# Patient Record
Sex: Female | Born: 1935 | Race: White | Hispanic: No | Marital: Married | Smoking: Never smoker
Health system: Southern US, Community
[De-identification: ages and names within clinical notes are randomized; demographics above are authoritative.]

## PROBLEM LIST (undated history)

## (undated) DIAGNOSIS — F419 Anxiety disorder, unspecified: Secondary | ICD-10-CM

## (undated) DIAGNOSIS — I509 Heart failure, unspecified: Secondary | ICD-10-CM

## (undated) DIAGNOSIS — J189 Pneumonia, unspecified organism: Secondary | ICD-10-CM

## (undated) DIAGNOSIS — I2510 Atherosclerotic heart disease of native coronary artery without angina pectoris: Secondary | ICD-10-CM

## (undated) DIAGNOSIS — C20 Malignant neoplasm of rectum: Secondary | ICD-10-CM

## (undated) DIAGNOSIS — M1990 Unspecified osteoarthritis, unspecified site: Secondary | ICD-10-CM

## (undated) DIAGNOSIS — I10 Essential (primary) hypertension: Secondary | ICD-10-CM

## (undated) HISTORY — DX: Anxiety disorder, unspecified: F41.9

## (undated) HISTORY — DX: Pneumonia, unspecified organism: J18.9

## (undated) HISTORY — PX: CHOLECYSTECTOMY: SHX55

## (undated) HISTORY — DX: Malignant neoplasm of rectum: C20

## (undated) HISTORY — DX: Atherosclerotic heart disease of native coronary artery without angina pectoris: I25.10

## (undated) HISTORY — DX: Heart failure, unspecified: I50.9

## (undated) HISTORY — DX: Essential (primary) hypertension: I10

## (undated) HISTORY — PX: OTHER SURGICAL HISTORY: SHX169

## (undated) HISTORY — DX: Unspecified osteoarthritis, unspecified site: M19.90

---

## 1971-04-01 HISTORY — PX: TOTAL ABDOMINAL HYSTERECTOMY: SHX209

## 2001-07-02 ENCOUNTER — Other Ambulatory Visit: Admission: RE | Admit: 2001-07-02 | Discharge: 2001-07-02 | Payer: Self-pay | Admitting: Family Medicine

## 2001-07-02 ENCOUNTER — Encounter: Payer: Self-pay | Admitting: Family Medicine

## 2004-05-15 ENCOUNTER — Ambulatory Visit: Payer: Self-pay | Admitting: Family Medicine

## 2004-07-18 ENCOUNTER — Ambulatory Visit: Payer: Self-pay | Admitting: Family Medicine

## 2004-07-22 ENCOUNTER — Ambulatory Visit: Payer: Self-pay | Admitting: Family Medicine

## 2004-08-07 ENCOUNTER — Ambulatory Visit: Payer: Self-pay | Admitting: Family Medicine

## 2004-08-16 ENCOUNTER — Ambulatory Visit: Payer: Self-pay | Admitting: Family Medicine

## 2004-11-14 ENCOUNTER — Ambulatory Visit: Payer: Self-pay | Admitting: Family Medicine

## 2005-02-11 ENCOUNTER — Ambulatory Visit: Payer: Self-pay | Admitting: Family Medicine

## 2005-02-14 ENCOUNTER — Ambulatory Visit: Payer: Self-pay | Admitting: Family Medicine

## 2005-08-14 ENCOUNTER — Ambulatory Visit: Payer: Self-pay | Admitting: Family Medicine

## 2005-08-18 ENCOUNTER — Ambulatory Visit: Payer: Self-pay | Admitting: Family Medicine

## 2005-08-19 ENCOUNTER — Ambulatory Visit: Payer: Self-pay | Admitting: Family Medicine

## 2005-09-05 ENCOUNTER — Ambulatory Visit: Payer: Self-pay | Admitting: Family Medicine

## 2005-11-14 ENCOUNTER — Ambulatory Visit: Payer: Self-pay | Admitting: Family Medicine

## 2005-11-18 ENCOUNTER — Ambulatory Visit: Payer: Self-pay | Admitting: Family Medicine

## 2006-03-17 ENCOUNTER — Ambulatory Visit: Payer: Self-pay | Admitting: Family Medicine

## 2006-08-19 ENCOUNTER — Encounter: Payer: Self-pay | Admitting: Family Medicine

## 2006-08-21 ENCOUNTER — Ambulatory Visit: Payer: Self-pay | Admitting: Family Medicine

## 2006-08-21 LAB — CONVERTED CEMR LAB
ALT: 18 units/L (ref 0–40)
AST: 29 units/L (ref 0–37)
Basophils Relative: 0.5 % (ref 0.0–1.0)
Bilirubin, Direct: 0.1 mg/dL (ref 0.0–0.3)
CO2: 34 meq/L — ABNORMAL HIGH (ref 19–32)
Calcium: 9.6 mg/dL (ref 8.4–10.5)
Chloride: 103 meq/L (ref 96–112)
Eosinophils Relative: 5.2 % — ABNORMAL HIGH (ref 0.0–5.0)
Glucose, Bld: 97 mg/dL (ref 70–99)
HCT: 41.4 % (ref 36.0–46.0)
HDL: 45.2 mg/dL (ref >39.0)
Lymphocytes Relative: 34.3 % (ref 12.0–46.0)
Platelets: 226 10*3/uL (ref 150–400)
RBC: 4.51 M/uL (ref 3.87–5.11)
Total Bilirubin: 1.2 mg/dL (ref 0.3–1.2)
Total Protein: 6.6 g/dL (ref 6.0–8.3)
Triglycerides: 165 mg/dL — ABNORMAL HIGH (ref 0–149)
VLDL: 33 mg/dL (ref 0–40)
WBC: 5.7 10*3/uL (ref 4.5–10.5)

## 2006-08-24 DIAGNOSIS — E039 Hypothyroidism, unspecified: Secondary | ICD-10-CM | POA: Insufficient documentation

## 2006-08-24 DIAGNOSIS — I10 Essential (primary) hypertension: Secondary | ICD-10-CM | POA: Insufficient documentation

## 2006-08-24 DIAGNOSIS — E7800 Pure hypercholesterolemia, unspecified: Secondary | ICD-10-CM

## 2006-08-24 DIAGNOSIS — F411 Generalized anxiety disorder: Secondary | ICD-10-CM | POA: Insufficient documentation

## 2006-08-24 DIAGNOSIS — R32 Unspecified urinary incontinence: Secondary | ICD-10-CM | POA: Insufficient documentation

## 2006-08-25 ENCOUNTER — Ambulatory Visit: Payer: Self-pay | Admitting: Family Medicine

## 2006-08-27 LAB — CONVERTED CEMR LAB: OCCULT 1: NEGATIVE

## 2006-09-08 ENCOUNTER — Ambulatory Visit: Payer: Self-pay | Admitting: Family Medicine

## 2006-09-08 LAB — HM MAMMOGRAPHY: HM Mammogram: NORMAL

## 2006-09-09 ENCOUNTER — Encounter: Payer: Self-pay | Admitting: Family Medicine

## 2006-09-10 ENCOUNTER — Ambulatory Visit: Payer: Self-pay | Admitting: Family Medicine

## 2006-09-11 ENCOUNTER — Encounter (INDEPENDENT_AMBULATORY_CARE_PROVIDER_SITE_OTHER): Payer: Self-pay | Admitting: *Deleted

## 2006-10-25 ENCOUNTER — Emergency Department: Payer: Self-pay | Admitting: Emergency Medicine

## 2006-10-27 ENCOUNTER — Inpatient Hospital Stay: Payer: Self-pay | Admitting: Internal Medicine

## 2006-11-04 ENCOUNTER — Ambulatory Visit: Payer: Self-pay | Admitting: Family Medicine

## 2006-11-04 LAB — CONVERTED CEMR LAB
BUN: 13 mg/dL (ref 6–23)
CO2: 30 meq/L (ref 19–32)
Calcium: 9.1 mg/dL (ref 8.4–10.5)
Creatinine, Ser: 1 mg/dL (ref 0.4–1.2)
GFR calc Af Amer: 68 mL/min

## 2006-11-16 ENCOUNTER — Ambulatory Visit: Payer: Self-pay | Admitting: Family Medicine

## 2006-11-16 LAB — CONVERTED CEMR LAB
Glucose, Urine, Semiquant: NEGATIVE
Protein, U semiquant: 30
pH: 7.5

## 2006-11-17 ENCOUNTER — Encounter: Payer: Self-pay | Admitting: Family Medicine

## 2006-11-18 ENCOUNTER — Telehealth: Payer: Self-pay | Admitting: Family Medicine

## 2006-11-29 ENCOUNTER — Encounter: Payer: Self-pay | Admitting: Family Medicine

## 2006-12-01 ENCOUNTER — Ambulatory Visit: Payer: Self-pay | Admitting: Family Medicine

## 2006-12-01 LAB — CONVERTED CEMR LAB
Bilirubin Urine: NEGATIVE
Nitrite: NEGATIVE
Protein, U semiquant: NEGATIVE
Specific Gravity, Urine: <1.005
WBC Urine, dipstick: NEGATIVE

## 2007-02-12 ENCOUNTER — Ambulatory Visit: Payer: Self-pay | Admitting: Family Medicine

## 2007-02-14 LAB — CONVERTED CEMR LAB
CO2: 33 meq/L — ABNORMAL HIGH (ref 19–32)
Creatinine, Ser: 0.9 mg/dL (ref 0.4–1.2)
Glucose, Bld: 100 mg/dL — ABNORMAL HIGH (ref 70–99)
Potassium: 3.2 meq/L — ABNORMAL LOW (ref 3.5–5.1)
Sodium: 139 meq/L (ref 135–145)

## 2007-02-15 ENCOUNTER — Ambulatory Visit: Payer: Self-pay | Admitting: Family Medicine

## 2007-03-12 ENCOUNTER — Ambulatory Visit: Payer: Self-pay | Admitting: Family Medicine

## 2007-03-17 ENCOUNTER — Ambulatory Visit: Payer: Self-pay | Admitting: Family Medicine

## 2007-03-17 DIAGNOSIS — E876 Hypokalemia: Secondary | ICD-10-CM

## 2007-03-19 ENCOUNTER — Telehealth: Payer: Self-pay | Admitting: Family Medicine

## 2007-04-29 ENCOUNTER — Ambulatory Visit: Payer: Self-pay | Admitting: Family Medicine

## 2007-04-29 LAB — CONVERTED CEMR LAB
CO2: 31 meq/L (ref 19–32)
Chloride: 101 meq/L (ref 96–112)
Creatinine, Ser: 1.1 mg/dL (ref 0.4–1.2)
Glucose, Bld: 95 mg/dL (ref 70–99)
Sodium: 141 meq/L (ref 135–145)

## 2007-05-06 ENCOUNTER — Ambulatory Visit: Payer: Self-pay | Admitting: Family Medicine

## 2007-09-14 ENCOUNTER — Ambulatory Visit: Payer: Self-pay | Admitting: Family Medicine

## 2007-09-14 LAB — CONVERTED CEMR LAB
Alkaline Phosphatase: 68 units/L (ref 39–117)
Basophils Absolute: 0 10*3/uL (ref 0.0–0.1)
Bilirubin, Direct: 0.1 mg/dL (ref 0.0–0.3)
Calcium: 9.3 mg/dL (ref 8.4–10.5)
Free T4: 1.1 ng/dL (ref 0.6–1.6)
GFR calc Af Amer: 61 mL/min
Glucose, Bld: 105 mg/dL — ABNORMAL HIGH (ref 70–99)
HCT: 42.9 % (ref 36.0–46.0)
HDL: 44.5 mg/dL (ref >39.0)
INR: 1 (ref 0.8–1.0)
Lymphocytes Relative: 27.2 % (ref 12.0–46.0)
MCHC: 33.5 g/dL (ref 30.0–36.0)
Monocytes Absolute: 0.4 10*3/uL (ref 0.1–1.0)
Monocytes Relative: 7.7 % (ref 3.0–12.0)
Platelets: 199 10*3/uL (ref 150–400)
Potassium: 3.3 meq/L — ABNORMAL LOW (ref 3.5–5.1)
Prothrombin Time: 11.8 s (ref 10.9–13.3)
RDW: 13.5 % (ref 11.5–14.6)
Sodium: 141 meq/L (ref 135–145)
TSH: 4.33 microintl units/mL (ref 0.35–5.50)
Total Bilirubin: 1.3 mg/dL — ABNORMAL HIGH (ref 0.3–1.2)
Total Protein: 6.7 g/dL (ref 6.0–8.3)
Triglycerides: 140 mg/dL (ref 0–149)
VLDL: 28 mg/dL (ref 0–40)
aPTT: 28 s (ref 21.7–29.8)

## 2007-09-16 ENCOUNTER — Ambulatory Visit: Payer: Self-pay | Admitting: Family Medicine

## 2007-10-19 ENCOUNTER — Ambulatory Visit: Payer: Self-pay | Admitting: Family Medicine

## 2007-10-19 LAB — CONVERTED CEMR LAB: Potassium: 3.6 meq/L (ref 3.5–5.1)

## 2007-10-28 ENCOUNTER — Ambulatory Visit: Payer: Self-pay | Admitting: Family Medicine

## 2007-10-28 ENCOUNTER — Encounter (INDEPENDENT_AMBULATORY_CARE_PROVIDER_SITE_OTHER): Payer: Self-pay | Admitting: *Deleted

## 2007-10-28 LAB — CONVERTED CEMR LAB
OCCULT 1: NEGATIVE
OCCULT 3: NEGATIVE

## 2007-10-28 LAB — FECAL OCCULT BLOOD, GUAIAC: Fecal Occult Blood: NEGATIVE

## 2008-01-10 ENCOUNTER — Ambulatory Visit: Payer: Self-pay | Admitting: Family Medicine

## 2008-01-10 LAB — CONVERTED CEMR LAB
BUN: 18 mg/dL (ref 6–23)
Calcium: 9 mg/dL (ref 8.4–10.5)
Creatinine, Ser: 1 mg/dL (ref 0.4–1.2)
GFR calc Af Amer: 68 mL/min
GFR calc non Af Amer: 56 mL/min
Glucose, Bld: 112 mg/dL — ABNORMAL HIGH (ref 70–99)
Potassium: 3.7 meq/L (ref 3.5–5.1)

## 2008-02-15 ENCOUNTER — Ambulatory Visit: Payer: Self-pay | Admitting: Family Medicine

## 2008-02-15 LAB — CONVERTED CEMR LAB
Blood in Urine, dipstick: NEGATIVE
Glucose, Urine, Semiquant: NEGATIVE
Ketones, urine, test strip: NEGATIVE
Protein, U semiquant: NEGATIVE
RBC / HPF: 0

## 2008-03-15 ENCOUNTER — Ambulatory Visit: Payer: Self-pay | Admitting: Family Medicine

## 2008-04-19 ENCOUNTER — Ambulatory Visit: Payer: Self-pay | Admitting: Family Medicine

## 2008-04-19 LAB — CONVERTED CEMR LAB: TSH: 2.5 microintl units/mL (ref 0.35–5.50)

## 2008-04-26 ENCOUNTER — Ambulatory Visit: Payer: Self-pay | Admitting: Family Medicine

## 2008-06-29 ENCOUNTER — Ambulatory Visit: Payer: Self-pay | Admitting: Family Medicine

## 2008-06-29 LAB — CONVERTED CEMR LAB
Calcium: 9.3 mg/dL (ref 8.4–10.5)
GFR calc non Af Amer: 56.24 mL/min (ref >60)
Glucose, Bld: 89 mg/dL (ref 70–99)
Sodium: 137 meq/L (ref 135–145)

## 2008-07-04 ENCOUNTER — Ambulatory Visit: Payer: Self-pay | Admitting: Family Medicine

## 2008-07-04 LAB — CONVERTED CEMR LAB
Albumin: 3.3 g/dL — ABNORMAL LOW (ref 3.5–5.2)
Alkaline Phosphatase: 62 units/L (ref 39–117)
Bilirubin, Direct: 0.2 mg/dL (ref 0.0–0.3)

## 2008-07-17 ENCOUNTER — Ambulatory Visit: Payer: Self-pay | Admitting: Family Medicine

## 2008-07-17 ENCOUNTER — Telehealth: Payer: Self-pay | Admitting: Family Medicine

## 2008-07-24 ENCOUNTER — Ambulatory Visit: Payer: Self-pay | Admitting: Family Medicine

## 2008-12-27 ENCOUNTER — Ambulatory Visit: Payer: Self-pay | Admitting: Family Medicine

## 2008-12-27 LAB — CONVERTED CEMR LAB
AST: 23 units/L (ref 0–37)
Alkaline Phosphatase: 68 units/L (ref 39–117)
BUN: 22 mg/dL (ref 6–23)
Basophils Absolute: 0 10*3/uL (ref 0.0–0.1)
Bilirubin, Direct: 0.1 mg/dL (ref 0.0–0.3)
Calcium: 9.4 mg/dL (ref 8.4–10.5)
Creatinine, Ser: 1.1 mg/dL (ref 0.4–1.2)
Direct LDL: 174.8 mg/dL
GFR calc non Af Amer: 50.32 mL/min (ref >60)
Glucose, Bld: 99 mg/dL (ref 70–99)
HDL: 44 mg/dL (ref >39.00)
Hemoglobin: 14.4 g/dL (ref 12.0–15.0)
Lymphocytes Relative: 30.2 % (ref 12.0–46.0)
Monocytes Relative: 9.4 % (ref 3.0–12.0)
Neutrophils Relative %: 55 % (ref 43.0–77.0)
Platelets: 211 10*3/uL (ref 150.0–400.0)
RDW: 13 % (ref 11.5–14.6)
Sodium: 140 meq/L (ref 135–145)
Total Bilirubin: 1 mg/dL (ref 0.3–1.2)
Triglycerides: 153 mg/dL — ABNORMAL HIGH (ref 0.0–149.0)

## 2008-12-28 LAB — CONVERTED CEMR LAB: Vit D, 25-Hydroxy: 33 ng/mL (ref 30–89)

## 2009-01-03 ENCOUNTER — Ambulatory Visit: Payer: Self-pay | Admitting: Family Medicine

## 2009-01-10 ENCOUNTER — Ambulatory Visit: Payer: Self-pay | Admitting: Family Medicine

## 2009-01-10 DIAGNOSIS — K648 Other hemorrhoids: Secondary | ICD-10-CM

## 2009-01-16 ENCOUNTER — Ambulatory Visit: Payer: Self-pay | Admitting: Family Medicine

## 2009-01-18 ENCOUNTER — Telehealth: Payer: Self-pay | Admitting: Family Medicine

## 2009-01-22 ENCOUNTER — Ambulatory Visit: Payer: Self-pay | Admitting: Family Medicine

## 2009-01-23 ENCOUNTER — Encounter: Payer: Self-pay | Admitting: Family Medicine

## 2009-01-23 LAB — CONVERTED CEMR LAB
ALT: 14 units/L (ref 0–35)
Albumin: 3.7 g/dL (ref 3.5–5.2)
Alkaline Phosphatase: 75 units/L (ref 39–117)
Basophils Relative: 1 % (ref 0.0–3.0)
Bilirubin, Direct: 0.1 mg/dL (ref 0.0–0.3)
CO2: 31 meq/L (ref 19–32)
Chloride: 96 meq/L (ref 96–112)
Eosinophils Absolute: 0.1 10*3/uL (ref 0.0–0.7)
Eosinophils Relative: 2 % (ref 0.0–5.0)
Hemoglobin: 14.1 g/dL (ref 12.0–15.0)
Lymphocytes Relative: 22.9 % (ref 12.0–46.0)
MCHC: 33.5 g/dL (ref 30.0–36.0)
MCV: 95 fL (ref 78.0–100.0)
Monocytes Absolute: 0.6 10*3/uL (ref 0.1–1.0)
Neutro Abs: 4.5 10*3/uL (ref 1.4–7.7)
RBC: 4.42 M/uL (ref 3.87–5.11)
Sodium: 137 meq/L (ref 135–145)
Total Protein: 6.6 g/dL (ref 6.0–8.3)
WBC: 6.9 10*3/uL (ref 4.5–10.5)

## 2009-03-22 ENCOUNTER — Telehealth: Payer: Self-pay | Admitting: Family Medicine

## 2009-06-15 ENCOUNTER — Telehealth: Payer: Self-pay | Admitting: Family Medicine

## 2009-07-09 ENCOUNTER — Ambulatory Visit: Payer: Self-pay | Admitting: Family Medicine

## 2009-07-30 ENCOUNTER — Ambulatory Visit: Payer: Self-pay | Admitting: Family Medicine

## 2009-08-13 ENCOUNTER — Ambulatory Visit: Payer: Self-pay | Admitting: Family Medicine

## 2009-08-16 ENCOUNTER — Telehealth: Payer: Self-pay | Admitting: Family Medicine

## 2009-08-23 ENCOUNTER — Telehealth: Payer: Self-pay | Admitting: Family Medicine

## 2009-09-12 ENCOUNTER — Telehealth: Payer: Self-pay | Admitting: Family Medicine

## 2009-09-13 ENCOUNTER — Ambulatory Visit: Payer: Self-pay | Admitting: Family Medicine

## 2009-11-05 ENCOUNTER — Encounter (INDEPENDENT_AMBULATORY_CARE_PROVIDER_SITE_OTHER): Payer: Self-pay | Admitting: *Deleted

## 2009-11-15 ENCOUNTER — Ambulatory Visit: Payer: Self-pay | Admitting: Family Medicine

## 2009-11-15 DIAGNOSIS — R21 Rash and other nonspecific skin eruption: Secondary | ICD-10-CM | POA: Insufficient documentation

## 2009-11-22 ENCOUNTER — Telehealth: Payer: Self-pay | Admitting: Family Medicine

## 2009-12-10 ENCOUNTER — Ambulatory Visit: Payer: Self-pay | Admitting: Family Medicine

## 2009-12-11 LAB — CONVERTED CEMR LAB
BUN: 16 mg/dL (ref 6–23)
Calcium: 8.8 mg/dL (ref 8.4–10.5)
GFR calc non Af Amer: 67.6 mL/min (ref >60)
Glucose, Bld: 93 mg/dL (ref 70–99)
Potassium: 3.9 meq/L (ref 3.5–5.1)
TSH: 1.88 microintl units/mL (ref 0.35–5.50)

## 2010-03-31 HISTORY — PX: CORONARY ARTERY BYPASS GRAFT: SHX141

## 2010-04-16 ENCOUNTER — Encounter: Payer: Self-pay | Admitting: Family Medicine

## 2010-04-22 ENCOUNTER — Encounter: Payer: Self-pay | Admitting: Family Medicine

## 2010-04-22 ENCOUNTER — Inpatient Hospital Stay: Payer: Self-pay | Admitting: Specialist

## 2010-04-23 ENCOUNTER — Inpatient Hospital Stay (HOSPITAL_COMMUNITY)
Admission: EM | Admit: 2010-04-23 | Discharge: 2010-05-02 | DRG: 236 | Disposition: A | Discharge status: Skilled Nursing Facility | Payer: Medicare HMO | Source: Other Acute Inpatient Hospital | Attending: Thoracic Surgery (Cardiothoracic Vascular Surgery) | Admitting: Thoracic Surgery (Cardiothoracic Vascular Surgery)

## 2010-04-23 ENCOUNTER — Encounter: Payer: Self-pay | Admitting: Family Medicine

## 2010-04-23 DIAGNOSIS — I214 Non-ST elevation (NSTEMI) myocardial infarction: Principal | ICD-10-CM | POA: Diagnosis present

## 2010-04-23 DIAGNOSIS — E039 Hypothyroidism, unspecified: Secondary | ICD-10-CM | POA: Diagnosis present

## 2010-04-23 DIAGNOSIS — I9589 Other hypotension: Secondary | ICD-10-CM | POA: Diagnosis not present

## 2010-04-23 DIAGNOSIS — M810 Age-related osteoporosis without current pathological fracture: Secondary | ICD-10-CM | POA: Diagnosis present

## 2010-04-23 DIAGNOSIS — Z79899 Other long term (current) drug therapy: Secondary | ICD-10-CM

## 2010-04-23 DIAGNOSIS — I2510 Atherosclerotic heart disease of native coronary artery without angina pectoris: Secondary | ICD-10-CM | POA: Diagnosis present

## 2010-04-23 DIAGNOSIS — D72829 Elevated white blood cell count, unspecified: Secondary | ICD-10-CM | POA: Diagnosis not present

## 2010-04-23 DIAGNOSIS — Z7982 Long term (current) use of aspirin: Secondary | ICD-10-CM

## 2010-04-23 DIAGNOSIS — I10 Essential (primary) hypertension: Secondary | ICD-10-CM | POA: Diagnosis present

## 2010-04-23 DIAGNOSIS — E8779 Other fluid overload: Secondary | ICD-10-CM | POA: Diagnosis not present

## 2010-04-23 DIAGNOSIS — E876 Hypokalemia: Secondary | ICD-10-CM | POA: Diagnosis not present

## 2010-04-23 DIAGNOSIS — D696 Thrombocytopenia, unspecified: Secondary | ICD-10-CM | POA: Diagnosis not present

## 2010-04-23 DIAGNOSIS — D62 Acute posthemorrhagic anemia: Secondary | ICD-10-CM | POA: Diagnosis not present

## 2010-04-23 DIAGNOSIS — I498 Other specified cardiac arrhythmias: Secondary | ICD-10-CM | POA: Diagnosis not present

## 2010-04-24 LAB — COMPREHENSIVE METABOLIC PANEL
Alkaline Phosphatase: 52 U/L (ref 39–117)
BUN: 16 mg/dL (ref 6–23)
Chloride: 103 mEq/L (ref 96–112)
Glucose, Bld: 116 mg/dL — ABNORMAL HIGH (ref 70–99)
Potassium: 3.8 mEq/L (ref 3.5–5.1)
Total Bilirubin: 1.1 mg/dL (ref 0.3–1.2)

## 2010-04-24 LAB — HEMOGLOBIN AND HEMATOCRIT, BLOOD: HCT: 21.2 % — ABNORMAL LOW (ref 36.0–46.0)

## 2010-04-24 LAB — CBC
HCT: 34.3 % — ABNORMAL LOW (ref 36.0–46.0)
HCT: 35.3 % — ABNORMAL LOW (ref 36.0–46.0)
Hemoglobin: 11.3 g/dL — ABNORMAL LOW (ref 12.0–15.0)
Hemoglobin: 11.8 g/dL — ABNORMAL LOW (ref 12.0–15.0)
MCH: 29.9 pg (ref 26.0–34.0)
MCHC: 33.4 g/dL (ref 30.0–36.0)
MCHC: 33.6 g/dL (ref 30.0–36.0)
MCV: 90.7 fL (ref 78.0–100.0)
MCV: 89 fL (ref 78.0–100.0)
Platelets: 112 10*3/uL — ABNORMAL LOW (ref 150–400)
RBC: 3.78 MIL/uL — ABNORMAL LOW (ref 3.87–5.11)
RDW: 14.9 % (ref 11.5–15.5)
RDW: 15.1 % (ref 11.5–15.5)
WBC: 8.3 10*3/uL (ref 4.0–10.5)
WBC: 21.1 10*3/uL — ABNORMAL HIGH (ref 4.0–10.5)

## 2010-04-24 LAB — GLUCOSE, CAPILLARY

## 2010-04-24 LAB — POCT I-STAT, CHEM 8
Chloride: 106 mEq/L (ref 96–112)
HCT: 24 % — ABNORMAL LOW (ref 36.0–46.0)
Hemoglobin: 8.2 g/dL — ABNORMAL LOW (ref 12.0–15.0)
Potassium: 4.2 mEq/L (ref 3.5–5.1)
Sodium: 138 mEq/L (ref 135–145)

## 2010-04-24 LAB — POCT I-STAT 3, ART BLOOD GAS (G3+)
Acid-base deficit: 2 mmol/L (ref 0.0–2.0)
Acid-base deficit: 6 mmol/L — ABNORMAL HIGH (ref 0.0–2.0)
Bicarbonate: 22.7 mEq/L (ref 20.0–24.0)
O2 Saturation: 100 %
O2 Saturation: 97 %
Patient temperature: 35.6
TCO2: 24 mmol/L (ref 0–100)
TCO2: 25 mmol/L (ref 0–100)
pCO2 arterial: 41.8 mmHg (ref 35.0–45.0)
pH, Arterial: 7.391 (ref 7.350–7.400)
pH, Arterial: 7.358 (ref 7.350–7.400)
pO2, Arterial: 275 mmHg — ABNORMAL HIGH (ref 80.0–100.0)
pO2, Arterial: 97 mmHg (ref 80.0–100.0)

## 2010-04-24 LAB — BLOOD GAS, ARTERIAL
Bicarbonate: 24.3 mEq/L — ABNORMAL HIGH (ref 20.0–24.0)
O2 Saturation: 97.5 %
Patient temperature: 98.6
TCO2: 25.4 mmol/L (ref 0–100)
pH, Arterial: 7.444 — ABNORMAL HIGH (ref 7.350–7.400)
pO2, Arterial: 90.4 mmHg (ref 80.0–100.0)

## 2010-04-24 LAB — HEPARIN LEVEL (UNFRACTIONATED): Heparin Unfractionated: 0.26 IU/mL — ABNORMAL LOW (ref 0.30–0.70)

## 2010-04-24 LAB — POCT I-STAT 4, (NA,K, GLUC, HGB,HCT)
Glucose, Bld: 120 mg/dL — ABNORMAL HIGH (ref 70–99)
Glucose, Bld: 121 mg/dL — ABNORMAL HIGH (ref 70–99)
HCT: 33 % — ABNORMAL LOW (ref 36.0–46.0)
HCT: 20 % — ABNORMAL LOW (ref 36.0–46.0)
HCT: 35 % — ABNORMAL LOW (ref 36.0–46.0)
Hemoglobin: 11.2 g/dL — ABNORMAL LOW (ref 12.0–15.0)
Hemoglobin: 7.1 g/dL — ABNORMAL LOW (ref 12.0–15.0)
Hemoglobin: 6.8 g/dL — CL (ref 12.0–15.0)
Potassium: 3.2 mEq/L — ABNORMAL LOW (ref 3.5–5.1)
Potassium: 4 mEq/L (ref 3.5–5.1)
Sodium: 138 mEq/L (ref 135–145)
Sodium: 134 mEq/L — ABNORMAL LOW (ref 135–145)
Sodium: 136 mEq/L (ref 135–145)
Sodium: 137 mEq/L (ref 135–145)

## 2010-04-24 LAB — PROTIME-INR
INR: 1.39 (ref 0.00–1.49)
Prothrombin Time: 13.4 seconds (ref 11.6–15.2)

## 2010-04-24 LAB — URINALYSIS, ROUTINE W REFLEX MICROSCOPIC
Bilirubin Urine: NEGATIVE
Hgb urine dipstick: NEGATIVE
Specific Gravity, Urine: 1.013 (ref 1.005–1.030)
pH: 6 (ref 5.0–8.0)

## 2010-04-24 LAB — PLATELET COUNT: Platelets: 124 10*3/uL — ABNORMAL LOW (ref 150–400)

## 2010-04-24 LAB — MRSA PCR SCREENING: MRSA by PCR: NEGATIVE

## 2010-04-24 LAB — MAGNESIUM: Magnesium: 2.9 mg/dL — ABNORMAL HIGH (ref 1.5–2.5)

## 2010-04-24 LAB — APTT: aPTT: 34 seconds (ref 24–37)

## 2010-04-25 DIAGNOSIS — I2510 Atherosclerotic heart disease of native coronary artery without angina pectoris: Secondary | ICD-10-CM | POA: Insufficient documentation

## 2010-04-25 LAB — POCT I-STAT, CHEM 8
BUN: 13 mg/dL (ref 6–23)
Calcium, Ion: 1.23 mmol/L (ref 1.12–1.32)
Creatinine, Ser: 1 mg/dL (ref 0.4–1.2)
TCO2: 23 mmol/L (ref 0–100)

## 2010-04-25 LAB — GLUCOSE, CAPILLARY
Glucose-Capillary: 104 mg/dL — ABNORMAL HIGH (ref 70–99)
Glucose-Capillary: 96 mg/dL (ref 70–99)
Glucose-Capillary: 98 mg/dL (ref 70–99)
Glucose-Capillary: 139 mg/dL — ABNORMAL HIGH (ref 70–99)
Glucose-Capillary: 171 mg/dL — ABNORMAL HIGH (ref 70–99)

## 2010-04-25 LAB — CBC
Hemoglobin: 8.2 g/dL — ABNORMAL LOW (ref 12.0–15.0)
MCH: 29.9 pg (ref 26.0–34.0)
MCH: 30.8 pg (ref 26.0–34.0)
MCHC: 33.7 g/dL (ref 30.0–36.0)
MCHC: 34.8 g/dL (ref 30.0–36.0)
MCV: 88.5 fL (ref 78.0–100.0)
Platelets: 96 10*3/uL — ABNORMAL LOW (ref 150–400)
RDW: 15.3 % (ref 11.5–15.5)
WBC: 11.8 10*3/uL — ABNORMAL HIGH (ref 4.0–10.5)

## 2010-04-25 LAB — BASIC METABOLIC PANEL
CO2: 21 mEq/L (ref 19–32)
Calcium: 8 mg/dL — ABNORMAL LOW (ref 8.4–10.5)
Creatinine, Ser: 0.92 mg/dL (ref 0.4–1.2)
GFR calc Af Amer: >60 mL/min (ref >60)

## 2010-04-25 NOTE — Op Note (Addendum)
[name], BIGNELL             ACCOUNT NO.:  [id number]  MEDICAL RECORD NO.:  [id number]          PATIENT TYPE:  INP  LOCATION:  2313                         FACILITY:  [facility]  PHYSICIAN:  Salvatore Decent. Hendrickson, M.D.DATE OF BIRTH:  [birth year]  DATE OF PROCEDURE:  04/24/2010 DATE OF DISCHARGE:                              OPERATIVE REPORT   PREOPERATIVE DIAGNOSIS:  Critical left main and three-vessel disease, status post non-Q-wave myocardial infarction.  POSTOPERATIVE DIAGNOSIS:  Critical left main and three-vessel disease, status post non-Q-wave myocardial infarction.  PROCEDURES:  Median sternotomy, extracorporeal circulation; coronary artery bypass grafting x3 (left internal mammary artery to left anterior descending, saphenous vein graft to obtuse marginal-1 , saphenous vein graft to distal right coronary); endoscopic vein harvest, right leg.  SURGEON:  Salvatore Decent. Dorris Fetch, MD  ASSISTANT:  Stephanie Acre. Dasovich, PA  ANESTHESIA:  General.  FINDINGS:  PD not graftable.  Good quality targets.  Good quality conduits.  Sternal osteoporosis.  CLINICAL NOTE:  Betty Dunn is an 77 year old woman who presented with unstable coronary syndrome.  She ruled in for non-Q-wave myocardial infarction.  She underwent cardiac catheterization at [facility] which revealed critical 95% left main stenosis as well as a tight ostial stenosis in her right coronary and a 95% stenosis in the midportion of her posterior descending.  She was advised to undergo coronary artery bypass grafting.  Indications, risks, benefits, and alternatives were discussed in detail with the patient.  She understood and accepted the risks and agreed to proceed.  OPERATIVE NOTE:  [name] was brought to the preop holding area on April 24, 2010.  There, the Anesthesia Service under direction of Dr. Arta Bruce placed an arterial blood pressure monitoring line and Swan- Ganz catheter.   Intravenous antibiotics were administered.  She was taken to the operating room, anesthetized, and intubated.  A Foley catheter was placed.  The chest, abdomen, and legs were prepped and draped in usual fashion.  An incision was made on the medial aspect of the right leg at the level of the native greater saphenous vein was harvested endoscopically from groin to upper calf.  It was a large caliber but relatively good quality vein.  Simultaneously, a median sternotomy was performed and the left internal mammary artery was harvested using standard technique.  There was sternal osteoporosis. The mammary artery was expected size and quality.  A 2000 units of heparin was administered during the vessel harvest and remaining of the full heparin dose was given prior to opening the pericardium.  Pericardium was opened.  The ascending aorta was inspected.  There was no palpable atherosclerotic disease.  The aorta was cannulated via concentric 2-0 Ethibond pledgeted pursestring sutures.  A dual-stage venous cannula was placed via pursestring suture in the atrial appendage.  Cardiopulmonary bypass was instituted, and the patient was cooled to 32 degrees Celsius.  The coronary arteries were inspected and anastomotic sites were chosen.  Conduits were inspected and cut to length.  A foam pad was placed in the pericardium to insulate the heart and protect the left phrenic nerve.  A temperature probe was placed in the myocardial septum,  and a cardioplegia cannula was placed in the ascending aorta.  The aorta was cross-clamped.  The left ventricle was emptied via the aortic root vent.  Cardiac arrest was achieved with a combination of cold antegrade blood cardioplegia and topical iced saline.  A 1 liter of cardioplegia was administered.  The myocardial septal temperature cooled to 12 degrees Celsius following distal anastomoses were performed.  First, a reverse saphenous vein graft was placed  end-to-side to the distal right coronary artery.  Posterior descending was too small to graft distally, but the distal right coronary artery was a 1.5-mm good quality target.  The vein graft was relatively large in caliber and was anastomosed end-to-side with a running 7-0 Prolene suture.  All anastomoses were probed proximally and distally.  At their completion, each vein graft was used to administer cardioplegia to assess flow and hemostasis.  Next, a reverse saphenous vein graft was placed end-to-side to the first obtuse marginal.  This was the one dominant lateral branch, it was a 1.5- mm good quality target.  The vein graft was anastomosed end-to-side with a running 7-0 Prolene suture.  Again, there was good flow and good hemostasis.  Next, the left internal mammary artery was brought through a window in the pericardium.  The distal end was beveled and was anastomosed end-to- side to the distal LAD.  The LAD was a 1.5-mm good quality target. Mammary was a 1.5-mm good quality conduit with good flow.  The end-to- side anastomosis was performed with a running 8-0 Prolene suture.  At the completion of the mammary to LAD anastomosis, the bulldog clamp was briefly removed from the mammary artery.  Immediate rapid septal rewarming was noted.  The bulldog clamp was replaced.  The mammary pedicle was tacked to the epicardial surface of the heart with 6-0 Prolene sutures.  Additional cardioplegia was administered.  Rewarming was begun.  The cardioplegia cannula was removed from the ascending aorta.  Proximal vein graft anastomoses were performed with 4.5-mm punch aortotomies and running 6-0 Prolene sutures.  At the completion of final proximal anastomosis, the patient was placed in Trendelenburg position. Lidocaine was administered.  The aortic root was deaired and aortic cross-clamp was removed.  Total cross-clamp time was 51 minutes.  The patient required single defibrillation with 10  joules and resumed sinus rhythm thereafter.  While rewarming was completed, all proximal and distal anastomoses were inspected for hemostasis.  Epicardial pacing wires were placed on the right ventricle and right atrium.  When the patient had first come off bypass, it was noted to have marked ST elevations.  These did improve somewhat over the next several minutes prior to weaning from bypass, although there still was some ST elevation present.  When the patient had rewarmed to a core temperature of 37 degrees Celsius, she was weaned from cardiopulmonary bypass.  She was on a dopamine infusion at 3 mcg per kg per minute and DDD paced at 90 beats per minute time separation from bypass.  Total bypass time was 71 minutes.  A test dose of protamine was administered and was well tolerated.  The atrial and aortic cannulae were removed.  The remainder of the protamine was administered without incident.  Chest was irrigated with 1 liter of saline.  Hemostasis was achieved.  Pericardium was not reapproximated. A left pleural and single mediastinal chest tubes were placed through separate subcostal incisions.  The sternum was closed with heavy gauge interrupted sternal stainless steel wires.  The pectoralis fascia, subcutaneous tissue,  and skin were closed in standard fashion.  All sponge, needle, and instrument counts were correct at the end of the procedure.  The patient was taken from the operating room to the Surgical Intensive Care Unit in good condition.     Salvatore Decent Dorris Fetch, M.D.     SCH/MEDQ  D:  04/24/2010  T:  04/25/2010  Job:  [id number]  cc:   Bevelyn Buckles. Bensimhon, MD Dwana Curd. Para March, M.D.  Electronically Signed by Charlett Lango M.D. on 04/25/2010 11:33:58 AM

## 2010-04-25 NOTE — Consult Note (Addendum)
Dunn, Betty             ACCOUNT NO.:  [id number]  MEDICAL RECORD NO.:  [id number]          PATIENT TYPE:  INP  LOCATION:  2909                         FACILITY:  [facility]  PHYSICIAN:  Salvatore Decent. Dorris Fetch, M.D.DATE OF BIRTH:  1923/01/29  DATE OF CONSULTATION:  04/23/2010 DATE OF DISCHARGE:                                CONSULTATION   REASON FOR CONSULTATION:  Left main and three-vessel disease.  HISTORY OF PRESENT ILLNESS:  Betty Dunn is an 77 year old woman with a history of hypertension and hypothyroidism, but no prior cardiac history.  She was in fairly good health, she felt, until Sunday when she began having diarrhea.  She had it for about 24 hours, and then Monday morning, she developed left arm pain.  This was a constant pain, and she had it for about 12 hours and eventually went to [facility].  In the emergency room, she did not have any acute ST changes, but her troponin was 7.2.  She was started on heparin and nitroglycerin.  She did not have any further pain since admission.  She had cardiac catheterization today where she was found to have left main and three- vessel disease.  Her ejection fraction was approximately 35% with severe dyskinesis of the apex.  The patient is currently pain free, and again, has had no pain since admission to the hospital.  PAST MEDICAL HISTORY:  Significant for hypertension, hypokalemia, hypothyroidism, and osteoarthritis.  She recently had a steroid injection of her left knee.  PAST SURGICAL HISTORY:  Significant for hysterectomy.  MEDICATIONS PRIOR TO ADMISSION: 1. Aspirin 81 mg daily. 2. Levoxyl 25 mcg daily. 3. Hydrochlorothiazide 12.5 mg daily. 4. She also took potassium chloride daily.  ALLERGIES:  She has an allergy to CIPRO which causes a rash and also has an adverse reaction to milk of magnesia which causes gastrointestinal discomfort.  FAMILY HISTORY:  Significant for coronary artery disease.  SOCIAL  HISTORY:  She is widowed.  She lives by herself.  Her sister lives nearby.  She has one son, no grandchildren.  She does not use tobacco.  REVIEW OF SYSTEMS:  There is no arthritis pain.  Denies any prodromal symptoms leading up to her diarrhea.  She does not have any fevers, chills, or sweats.  Denies any previous pain such as this.  No problems with orthopnea, paroxysmal nocturnal dyspnea, or peripheral edema.  PHYSICAL EXAMINATION:  VITAL SIGNS:  Betty Dunn is an 77 year old woman who appears her stated age.  Her blood pressure is 86/55, pulse 77, respirations are 20, oxygen saturation 99% on 2 L nasal cannula. GENERAL APPEARANCE:  Elderly and frail. NEUROLOGIC:  She is alert, oriented x3 with no focal deficits. HEENT:  Unremarkable. NECK:  Supple without thyromegaly, adenopathy, or bruits. CARDIAC:  Regular rate and rhythm.  Normal S1 and S2.  No rubs, murmurs, or gallops. ABDOMEN:  Soft, nontender. LUNGS:  Clear with equal breath sounds. EXTREMITIES:  She has 2+ radial pulses bilaterally.  Faintly palpable posterior tibial and dorsalis pedis pulses bilaterally.  She does have some superficial varicosities.  LABORATORY DATA:  Glucose 119, sodium 137, potassium 3.4, BUN 25, creatinine  1.03, total bilirubin 0.7, albumin 3.4.  White count was 12.2, hematocrit 42, and platelets 248.  Her urinalysis was negative. Peak CK was 265 with MB of 30 and a troponin of 8.1.  Hemoglobin A1c is 6.  Cholesterol 184, HDL 49, and LDL 121.  TSH was elevated at 6.3.  IMPRESSION:  Betty Dunn is an 77 year old woman who presents with new onset left arm pain, consistent with unstable angina and ruled in for a non-Q-wave myocardial infarction.  On catheterization, she has severe left main and three-vessel coronary disease with impaired left ventricular function.  Coronary artery bypass grafting is indicated for survival, benefit, as well as relief of symptoms.  I have discussed in detail with her the  nature of the operation, incisions to be used, need for general anesthesia, expected hospital stay, and overall recovery.  I discussed in detail with her the indications, risks, benefits and alternative treatments, which in this case would be medical therapy. She does understand, the risks include but are not limited to death, stroke, myocardial infarction, deep vein thrombosis, pulmonary embolism, bleeding, possible need for transfusion, infections, as well as end- organ system dysfunction including respiratory or renal failure or GI complications.  I did discuss with her that given her advanced age, she is at higher risk than the average patient, but that surgery was really the only option that gave her a reasonable expectation of survival.  She understands and accepts the risks and agrees to proceed.  She is currently clinically stable with no ongoing pain.  She has been scheduled for first case tomorrow morning.  If the patient were to have recurrent symptoms, she was asked to notify the nursing staff immediately, and she might need to be done on a more emergent basis.     Salvatore Decent Dorris Fetch, M.D.     SCH/MEDQ  D:  04/23/2010  T:  04/24/2010  Job:  [id number]  cc:   Dwana Curd. Para March, M.D.  Electronically Signed by Charlett Lango M.D. on 04/25/2010 11:33:49 AM

## 2010-04-26 LAB — CBC
HCT: 21.6 % — ABNORMAL LOW (ref 36.0–46.0)
Hemoglobin: 7.4 g/dL — ABNORMAL LOW (ref 12.0–15.0)
MCV: 87.8 fL (ref 78.0–100.0)
RBC: 2.46 MIL/uL — ABNORMAL LOW (ref 3.87–5.11)
WBC: 13 10*3/uL — ABNORMAL HIGH (ref 4.0–10.5)

## 2010-04-26 LAB — BASIC METABOLIC PANEL
BUN: 13 mg/dL (ref 6–23)
Chloride: 101 mEq/L (ref 96–112)
Glucose, Bld: 146 mg/dL — ABNORMAL HIGH (ref 70–99)
Potassium: 4 mEq/L (ref 3.5–5.1)

## 2010-04-26 LAB — GLUCOSE, CAPILLARY: Glucose-Capillary: 119 mg/dL — ABNORMAL HIGH (ref 70–99)

## 2010-04-27 LAB — CROSSMATCH
ABO/RH(D): O POS
Unit division: 0

## 2010-04-27 LAB — BASIC METABOLIC PANEL
BUN: 18 mg/dL (ref 6–23)
GFR calc Af Amer: >60 mL/min (ref >60)
GFR calc non Af Amer: 51 mL/min — ABNORMAL LOW (ref >60)
Potassium: 4.1 mEq/L (ref 3.5–5.1)

## 2010-04-27 LAB — HEMOGLOBIN A1C: Hgb A1c MFr Bld: 5.5 % (ref <5.7)

## 2010-04-27 LAB — CBC
HCT: 25 % — ABNORMAL LOW (ref 36.0–46.0)
MCH: 30.4 pg (ref 26.0–34.0)
MCHC: 34 g/dL (ref 30.0–36.0)
RDW: 15.2 % (ref 11.5–15.5)

## 2010-04-27 LAB — GLUCOSE, CAPILLARY: Glucose-Capillary: 104 mg/dL — ABNORMAL HIGH (ref 70–99)

## 2010-04-28 LAB — CBC
HCT: 25.7 % — ABNORMAL LOW (ref 36.0–46.0)
Hemoglobin: 8.7 g/dL — ABNORMAL LOW (ref 12.0–15.0)
MCH: 30.9 pg (ref 26.0–34.0)
MCHC: 33.9 g/dL (ref 30.0–36.0)
MCV: 91.1 fL (ref 78.0–100.0)
Platelets: 178 10*3/uL (ref 150–400)
RBC: 2.82 MIL/uL — ABNORMAL LOW (ref 3.87–5.11)
RDW: 15.5 % (ref 11.5–15.5)
WBC: 13.4 10*3/uL — ABNORMAL HIGH (ref 4.0–10.5)

## 2010-04-28 LAB — BASIC METABOLIC PANEL
BUN: 22 mg/dL (ref 6–23)
CO2: 26 mEq/L (ref 19–32)
Calcium: 7.8 mg/dL — ABNORMAL LOW (ref 8.4–10.5)
Chloride: 97 mEq/L (ref 96–112)
Creatinine, Ser: 1.11 mg/dL (ref 0.4–1.2)
GFR calc Af Amer: 57 mL/min — ABNORMAL LOW (ref >60)
GFR calc non Af Amer: 47 mL/min — ABNORMAL LOW (ref >60)
Glucose, Bld: 108 mg/dL — ABNORMAL HIGH (ref 70–99)
Potassium: 3.3 mEq/L — ABNORMAL LOW (ref 3.5–5.1)
Sodium: 131 mEq/L — ABNORMAL LOW (ref 135–145)

## 2010-04-29 LAB — CBC
HCT: 26.8 % — ABNORMAL LOW (ref 36.0–46.0)
Hemoglobin: 8.9 g/dL — ABNORMAL LOW (ref 12.0–15.0)
MCH: 30.7 pg (ref 26.0–34.0)
MCHC: 33.2 g/dL (ref 30.0–36.0)
MCV: 92.4 fL (ref 78.0–100.0)
Platelets: 207 10*3/uL (ref 150–400)
RBC: 2.9 MIL/uL — ABNORMAL LOW (ref 3.87–5.11)
RDW: 16 % — ABNORMAL HIGH (ref 11.5–15.5)
WBC: 12.3 10*3/uL — ABNORMAL HIGH (ref 4.0–10.5)

## 2010-04-29 LAB — BASIC METABOLIC PANEL
BUN: 21 mg/dL (ref 6–23)
CO2: 28 mEq/L (ref 19–32)
Calcium: 8.2 mg/dL — ABNORMAL LOW (ref 8.4–10.5)
Chloride: 99 mEq/L (ref 96–112)
Creatinine, Ser: 1.04 mg/dL (ref 0.4–1.2)
GFR calc Af Amer: >60 mL/min (ref >60)
GFR calc non Af Amer: 50 mL/min — ABNORMAL LOW (ref >60)
Glucose, Bld: 125 mg/dL — ABNORMAL HIGH (ref 70–99)
Potassium: 3.5 mEq/L (ref 3.5–5.1)
Sodium: 136 mEq/L (ref 135–145)

## 2010-04-30 LAB — CBC
Hemoglobin: 8.5 g/dL — ABNORMAL LOW (ref 12.0–15.0)
MCV: 93 fL (ref 78.0–100.0)
Platelets: 222 10*3/uL (ref 150–400)
RBC: 2.7 MIL/uL — ABNORMAL LOW (ref 3.87–5.11)
WBC: 9.9 10*3/uL (ref 4.0–10.5)

## 2010-04-30 LAB — BASIC METABOLIC PANEL
BUN: 19 mg/dL (ref 6–23)
CO2: 29 mEq/L (ref 19–32)
Chloride: 98 mEq/L (ref 96–112)
Potassium: 3.4 mEq/L — ABNORMAL LOW (ref 3.5–5.1)

## 2010-04-30 NOTE — Assessment & Plan Note (Signed)
Summary: DISCUSS MEDICATIONS/CLE   Vital Signs:  Patient profile:   77 year old female Height:      60.5 inches Weight:      140.50 pounds BMI:     27.09 Temp:     97.9 degrees F oral Pulse rate:   80 / minute Pulse rhythm:   regular BP sitting:   142 / 70  (left arm) Cuff size:   regular  Vitals Entered By: Delilah Shan CMA Duncan Dull) (December 10, 2009 10:50 AM) CC: Discuss medication   History of Present Illness: HTN- Not itching.  Back on levothyroid and HCTZ.  No CP/SOB/ BLE edema.  Occ leg cramping on L leg.  No h/o DVT.  Has had cramping like this before.  It is improved from before.  It started about 2 weeks ago.  No trauma, no other c/o.   Allergies: 1)  ! * Septra Ds 2)  ! Sulfa 3)  ! 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  Otherwise negative.    Physical Exam  General:  GEN: nad, alert and oriented HEENT: mucous membranes moist NECK: supple w/o LA CV: rrr. PULM: ctab, no inc wob ABD: soft, +bs EXT: no edema SKIN: no rash   Impression & Recommendations:  Problem # 1:  Hx of HYPOTHYROIDISM, BORDERLINE (ICD-244.9) contact with labs.  No change in meds.  Her updated medication list for this problem includes:    Levothroid 25 Mcg Tabs (Levothyroxine sodium) .Marland Kitchen... Take 1 tablet by mouth every morning  Orders: TLB-TSH (Thyroid Stimulating Hormone) (84443-TSH)  Problem # 2:  HYPERTENSION (ICD-401.9) I  wouldn't change meds at this point.  I think the itching was due to amlodipine.  The following medications were removed from the medication list:    Amlodipine Besylate 5 Mg Tabs (Amlodipine besylate) ..... Hold   one tab by mouth at night    Triamterene-hctz 75-50 Mg Tabs (Triamterene-hctz) ..... Hold   take one by mouth daily Her updated medication list for this problem includes:    Hydrochlorothiazide 12.5 Mg Caps (Hydrochlorothiazide) .Marland Kitchen... Take 1 tablet by mouth every morning  Orders: TLB-BMP (Basic Metabolic Panel-BMET) (80048-METABOL)  Complete Medication List: 1)  Aspirin 81 Mg Tbec (Aspirin) .... Take one by mouth daily 2)  Hydrochlorothiazide 12.5 Mg Caps (Hydrochlorothiazide) .... Take 1 tablet by mouth every morning 3)  Potassium Chloride 10 Meq/137ml Soln (Potassium chloride) .... 3 teaspoons  daily by mouth 4)  Levothroid 25 Mcg Tabs (Levothyroxine sodium) .... Take 1 tablet by mouth every morning 5)  Voltaren 1 % Gel (Diclofenac sodium) .... Hold   apply to wrist 4 times daily 6)  Citrucel 500 Mg Tabs (Methylcellulose (laxative)) .... Take one by mouth daily 7)  Anusol-hc 25 Mg Supp (Hydrocortisone acetate) .... One suppository  per rectum as needed  Patient Instructions: 1)  Please schedule a follow-up appointment in 6 months.  You can get your results through our phone system.  Follow the instructions on  the blue card.  Take care.  Glad to see you today.  Prescriptions: LEVOTHROID 25 MCG  TABS (LEVOTHYROXINE SODIUM) Take 1 tablet by mouth every morning  #90 x 3   Entered and Authorized by:   Crawford Givens MD   Signed by:   Crawford Givens MD on 12/10/2009   Method used:   Electronically to        [facility] [phone]* (retail)       [address] [place], [place]  [address]       Ph: [phone]       Fax: [phone]   RxID:   [id number] HYDROCHLOROTHIAZIDE 12.5 MG CAPS (HYDROCHLOROTHIAZIDE) Take 1 tablet by mouth every morning  #90 x 3   Entered  and Authorized by:   Crawford Givens MD   Signed by:   Crawford Givens MD on 12/10/2009   Method used:   Electronically to        [facility] [phone]* (retail)       [address] [place], [place]  [address]       Ph: [phone]       Fax: [phone]   RxID:   [id number]   Current Allergies (reviewed today): ! Doylene Bode DS ! SULFA ! AMLODIPINE BESYLATE PENICILLIN CIPRO

## 2010-04-30 NOTE — Assessment & Plan Note (Signed)
Summary: 6 MONTH FOLLOW UP/[facility]   Vital Signs:  Patient profile:   77 year old female Weight:      133.75 pounds Temp:     98.1 degrees F rectal Pulse rate:   80 / minute Pulse rhythm:   regular BP sitting:   140 / 84  (left arm) Cuff size:   regular  Vitals Entered By: Sydell Axon LPN (July 09, 2009 9:57 AM) CC: 6 month follow-up    History of Present Illness: Pt here for 6 month recheck. She has problems with stool changing from hard to soft. She uses Citrucel every day. She doesn't drink as much water as she should. She drinks a lot of Pepsi and Milk. We discussed neither of these great for her. She has 2 milks a day and sometimes no sodas. We discussed drinking more fluids daily. She urinates mostly at night.  She thinks she urinates 3 or more times a day.  Problems Prior to Update: 1)  Diarrhea  (ICD-787.91) 2)  Fatigue, Acute  (ICD-780.79) 3)  Hemorrhoids, Internal, With Bleeding  (ICD-455.2) 4)  Blood in Stool  (ICD-578.1) 5)  Swelling Mass or Lump in Head and Neck  (ICD-784.2) 6)  Hypokalemia  (ICD-276.8) 7)  Screening For Malignannt Neoplasm, Site Nec  (ICD-V76.49) 8)  Postmenopausal On Hormone Replacement Therapy  (ICD-V07.4) 9)  Hx of Hypothyroidism, Borderline  (ICD-244.9) 10)  Hx of Hypercholesterolemia  (ICD-272.0) 11)  Urinary Incontinence  (ICD-788.30) 12)  Hypertension  (ICD-401.9) 13)  Anxiety  (ICD-300.00)  Medications Prior to Update: 1)  Aspirin 81 Mg Tbec (Aspirin) .... Take One By Mouth Daily 2)  Maxzide 75-50 Mg Tabs (Triamterene-Hctz) .... Take One By Mouth Every Am 3)  Potassium Chloride 10 Meq/151ml  Soln (Potassium Chloride) .... 3 Teaspoons  Daily By Mouth 4)  Levothroid 25 Mcg  Tabs (Levothyroxine Sodium) .... One Tab By Mouth Every Am 5)  Voltaren 1 %  Gel (Diclofenac Sodium) .... Apply To Wrist 4 Times Daily 6)  Anusol-Hc 25 Mg Supp (Hydrocortisone Acetate) .... Insert One Supp Per Rectum Three Times A Day 7)  Promethazine Hcl 25 Mg Tabs  (Promethazine Hcl) .... One Tab By Mouth Q6 As Needed  Allergies: 1)  ! * Septra Ds 2)  Penicillin 3)  Cipro  Physical Exam  General:  Well-developed,well-nourished,in no acute distress; alert,appropriate and cooperative throughout examination, typically bright-eyed.. Head:  Normocephalic and atraumatic without obvious abnormalities. No apparent alopecia or balding. Eyes:  Conjunctiva clear bilaterally.  Ears:  External ear exam shows no significant lesions or deformities.  Otoscopic examination reveals clear canals, tympanic membranes are intact bilaterally without bulging, retraction, inflammation or discharge. Hearing is grossly normal bilaterally. Nose:  External nasal examination shows no deformity or inflammation. Nasal mucosa are pink and moist without lesions or exudates. healed flap surgery result onleft tip of nose. Mouth:  Oral mucosa and oropharynx without lesions or exudates.  Teeth in good repair. Neck:  No deformities, masses, or tenderness noted. Chest Wall:  No deformities, masses, or tenderness noted. Lungs:  Normal respiratory effort, chest expands symmetrically. Lungs are clear to auscultation, no crackles or wheezes. Heart:  Normal rate and regular rhythm. S1 and S2 normal without gallop, murmur, click, rub or other extra sounds.   Impression & Recommendations:  Problem # 1:  HYPERTENSION (ICD-401.9) Assessment Deteriorated High the l;ast two times, will adjust meds. Decrease diuretic to poss help bowels as well and start Amlodipine. Will recheck in 6 weeks. Her updated medication list  for this problem includes:    Hydrochlorothiazide 12.5 Mg Caps (Hydrochlorothiazide) ..... One tab by mouth in am    Amlodipine Besylate 5 Mg Tabs (Amlodipine besylate) ..... One tab by mouth at night  BP today: 140/84 Prior BP: 150/80 (01/22/2009)  Labs Reviewed: K+: 3.4 (01/22/2009) Creat: : 1.2 (01/22/2009)   Chol: 240 (12/27/2008)   HDL: 44.00 (12/27/2008)   LDL: DEL  (09/14/2007)   TG: 153.0 (12/27/2008)  Problem # 2:  HYPOKALEMIA (ICD-276.8) Assessment: Unchanged Recheck next time.  Problem # 3:  DIARRHEA (ICD-787.91) Assessment: Unchanged  Change meds to see if can change bowel habis as well.  Discussed symptom control and diet. Call if worsening of symptoms or signs of dehydration.   Complete Medication List: 1)  Aspirin 81 Mg Tbec (Aspirin) .... Take one by mouth daily 2)  Hydrochlorothiazide 12.5 Mg Caps (Hydrochlorothiazide) .... One tab by mouth in am 3)  Potassium Chloride 10 Meq/154ml Soln (Potassium chloride) .... 3 teaspoons  daily by mouth 4)  Levothroid 25 Mcg Tabs (Levothyroxine sodium) .... One tab by mouth every am 5)  Voltaren 1 % Gel (Diclofenac sodium) .... Apply to wrist 4 times daily 6)  Anusol-hc 25 Mg Supp (Hydrocortisone acetate) .... Insert one supp per rectum three times a day 7)  Promethazine Hcl 25 Mg Tabs (Promethazine hcl) .... One tab by mouth q6 as needed 8)  Citrucel 500 Mg Tabs (Methylcellulose (laxative)) .... Take one by mouth daily 9)  Amlodipine Besylate 5 Mg Tabs (Amlodipine besylate) .... One tab by mouth at night  Other Orders: Prescription Created Electronically [id number]) Prescription Created Electronically [id number])   Patient Instructions: 1)  RTC 6 weeks for BP check, Bmet prior 401.9 Prescriptions: AMLODIPINE BESYLATE 5 MG TABS (AMLODIPINE BESYLATE) one tab by mouth at night  #30 x 12   Entered and Authorized by:   Shaune Leeks MD   Signed by:   Shaune Leeks MD on 07/09/2009   Method used:   Electronically to        [facility] [phone]* (retail)       [address] [place], [place]  [address]       Ph: [phone]       Fax: [phone]   RxID:   [id number] HYDROCHLOROTHIAZIDE 12.5 MG CAPS (HYDROCHLOROTHIAZIDE) one tab by mouth in AM  #30 x 12   Entered and Authorized by:   Shaune Leeks MD   Signed by:   Shaune Leeks MD on 07/09/2009   Method  used:   Electronically to        [facility] [phone]* (retail)       [address] [place], [place]  [address]       Ph: [phone]       Fax: [phone]   RxID:   [id number]   Current Allergies (reviewed today): ! * SEPTRA DS PENICILLIN CIPRO  Appended Document: 6 MONTH FOLLOW UP/[facility] Td next time.

## 2010-04-30 NOTE — Letter (Signed)
Summary: Nadara Eaton letter  [place] [facility]  [address] [place], [place] [address]   Phone: [phone]  Fax: [phone]       11/05/2009 MRN: [id number]  [facility] [address] [place], [place]  [address]  Dear Ms. [facility] [place], and [place] announce the retirement of Arta Silence, M.D., from full-time practice at the [facility] office effective September 27, 2009 and his plans of returning part-time.  It is important to Dr. Hetty Ely and to our practice that you understand that [facility] has seven physicians in our office for your health care needs.  We will continue to offer the same exceptional care that you have today.    Dr. Hetty Ely has spoken to many of you about his plans for retirement and returning part-time in the fall.   We will continue to work with you through the transition to schedule appointments for you in the office and meet the high standards that [place] is committed to.   Again, it is with great pleasure that we share the news that Dr. Hetty Ely will return to [facility] in October of 2011 with a reduced schedule.    If you have any questions, or would like to request an appointment with one of our physicians, please call us at [phone] and press the option for Scheduling an appointment.  We take pleasure in providing you with excellent patient care and look forward to seeing you at your next office visit.  Our [facility] Physicians are:  Tillman Abide, M.D. Laurita Quint, M.D. Roxy Manns, M.D. Kerby Nora, M.D. Hannah Beat, M.D. Ruthe Mannan, M.D. We proudly welcomed Raechel Ache, M.D. and Eustaquio Boyden, M.D. to the practice in July/August 2011.  Sincerely,  [facility]

## 2010-04-30 NOTE — Progress Notes (Signed)
Summary: pt stopped norvasc  Phone Note Call from Patient Call back at Home Phone [phone]   Caller: Patient Call For: Shaune Leeks MD Summary of Call: Pt went to see dermatologist this morning for a rash.  She was told to stop taking norvasc and hctz until she goes back to see her on 7/01.  She has already stopped hctz and has now stopped norvasc and started taking maxzide again.  Just an FYI. Initial call taken by: Lowella Petties CMA,  Aug 23, 2009 3:08 PM  Follow-up for Phone Call        noted.  Follow-up by: Shaune Leeks MD,  Aug 23, 2009 4:34 PM

## 2010-04-30 NOTE — Progress Notes (Signed)
Summary: Betty Dunn is some better  Phone Note Call from Patient Call back at Home Phone [phone]   Caller: Patient Summary of Call: Betty Dunn called to report that her rash is better today but she still has a few places that are popping up and itching.  She thinks it's her nerves now. Initial call taken by: Lowella Petties CMA,  November 22, 2009 4:36 PM  Follow-up for Phone Call        noted.  Have patient start back on the levothyroid and then let me know about her blood pressure.   Follow-up by: Crawford Givens MD,  November 22, 2009 5:33 PM  Additional Follow-up for Phone Call Additional follow up Details #1::        Left message on voicemail  in detail.  Personalized VM. Lugene Fuquay CMA Duncan Dull)  November 23, 2009 8:18 AM

## 2010-04-30 NOTE — Assessment & Plan Note (Signed)
Summary: 2WK F/U FOR BLOOD IN STOOL / LFW   Vital Signs:  Patient profile:   77 year old female Weight:      137.25 pounds Temp:     98.4 degrees F oral Pulse rate:   76 / minute Pulse rhythm:   regular BP sitting:   140 / 78  (left arm) Cuff size:   large  Vitals Entered By: Sydell Axon LPN (Aug 13, 2009 9:52 AM) CC: 2 Week follow-up on blood in stool, stools still look red, has red spots on arms   History of Present Illness: Pt here for followup of BMs. She says some times her BM is brown the way it is supoposed to be. She has had loose BMs this AM. She thought there was blood on the paper. She has no pain and doesn't think she has colon cancer. We discussed this at length.  She generally feels wwell.  Problems Prior to Update: 1)  Diarrhea  (ICD-787.91) 2)  Fatigue, Acute  (ICD-780.79) 3)  Hemorrhoids, Internal, With Bleeding  (ICD-455.2) 4)  Blood in Stool  (ICD-578.1) 5)  Swelling Mass or Lump in Head and Neck  (ICD-784.2) 6)  Hypokalemia  (ICD-276.8) 7)  Screening For Malignannt Neoplasm, Site Nec  (ICD-V76.49) 8)  Postmenopausal On Hormone Replacement Therapy  (ICD-V07.4) 9)  Hx of Hypothyroidism, Borderline  (ICD-244.9) 10)  Hx of Hypercholesterolemia  (ICD-272.0) 11)  Urinary Incontinence  (ICD-788.30) 12)  Hypertension  (ICD-401.9) 13)  Anxiety  (ICD-300.00)  Medications Prior to Update: 1)  Aspirin 81 Mg Tbec (Aspirin) .... Take One By Mouth Daily 2)  Hydrochlorothiazide 12.5 Mg Caps (Hydrochlorothiazide) .... One Tab By Mouth in Am 3)  Potassium Chloride 10 Meq/188ml  Soln (Potassium Chloride) .... 3 Teaspoons  Daily By Mouth 4)  Levothroid 25 Mcg  Tabs (Levothyroxine Sodium) .... One Tab By Mouth Every Am 5)  Voltaren 1 %  Gel (Diclofenac Sodium) .... Apply To Wrist 4 Times Daily 6)  Anusol-Hc 25 Mg Supp (Hydrocortisone Acetate) .... Insert One Supp Per Rectum Three Times A Day 7)  Citrucel 500 Mg Tabs (Methylcellulose (Laxative)) .... Take One By Mouth  Daily 8)  Amlodipine Besylate 5 Mg Tabs (Amlodipine Besylate) .... One Tab By Mouth At Night 9)  Anusol-Hc 25 Mg Supp (Hydrocortisone Acetate) .... One Supp Per Rectum  Allergies: 1)  ! * Septra Ds 2)  Penicillin 3)  Cipro  Physical Exam  General:  Well-developed,well-nourished,in no acute distress; alert,appropriate and cooperative throughout examination, typically bright-eyed.. Head:  Normocephalic and atraumatic without obvious abnormalities. No apparent alopecia or balding. Eyes:  Conjunctiva clear bilaterally.  Ears:  External ear exam shows no significant lesions or deformities.  Otoscopic examination reveals clear canals, tympanic membranes are intact bilaterally without bulging, retraction, inflammation or discharge. Hearing is grossly normal bilaterally. Nose:  External nasal examination shows no deformity or inflammation. Nasal mucosa are pink and moist without lesions or exudates. healed flap surgery result onleft tip of nose. Mouth:  Oral mucosa and oropharynx without lesions or exudates.  Teeth in good repair. Abdomen:  Bowel sounds positive,abdomen soft and non-tender without masses, organomegaly or hernias noted.    Impression & Recommendations:  Problem # 1:  BLOOD IN STOOL (ICD-578.1) Assessment Unchanged  She has continued to have what sounds like blood at times with BM, in BM or whatever. She sees no relationship to when or what she is eating. She is on a fiber substitute and her hemms have improved. I cannot rule out  carcinoma, esp at her age. I think she should see the GI folks. She prefers seeing GI at [facility] due to transportation..   Orders: Gastroenterology Referral (GI)  Problem # 2:  HYPERTENSION (ICD-401.9) Assessment: Unchanged Reasonably stable for now. Cont curr meds. Her updated medication list for this problem includes:    Hydrochlorothiazide 12.5 Mg Caps (Hydrochlorothiazide) ..... One tab by mouth in am    Amlodipine Besylate 5 Mg Tabs (Amlodipine  besylate) ..... One tab by mouth at night  BP today: 140/78 Prior BP: 140/88 (07/30/2009)  Labs Reviewed: K+: 3.4 (01/22/2009) Creat: : 1.2 (01/22/2009)   Chol: 240 (12/27/2008)   HDL: 44.00 (12/27/2008)   LDL: DEL (09/14/2007)   TG: 153.0 (12/27/2008)  Complete Medication List: 1)  Aspirin 81 Mg Tbec (Aspirin) .... Take one by mouth daily 2)  Hydrochlorothiazide 12.5 Mg Caps (Hydrochlorothiazide) .... One tab by mouth in am 3)  Potassium Chloride 10 Meq/161ml Soln (Potassium chloride) .... 3 teaspoons  daily by mouth 4)  Levothroid 25 Mcg Tabs (Levothyroxine sodium) .... One tab by mouth every am 5)  Voltaren 1 % Gel (Diclofenac sodium) .... Apply to wrist 4 times daily 6)  Anusol-hc 25 Mg Supp (Hydrocortisone acetate) .... Insert one supp per rectum three times a day 7)  Citrucel 500 Mg Tabs (Methylcellulose (laxative)) .... Take one by mouth daily 8)  Amlodipine Besylate 5 Mg Tabs (Amlodipine besylate) .... One tab by mouth at night 9)  Anusol-hc 25 Mg Supp (Hydrocortisone acetate) .... One supp per rectum  Patient Instructions: 1)  Refer to Gi in [place].  Current Allergies (reviewed today): ! * SEPTRA DS PENICILLIN CIPRO

## 2010-04-30 NOTE — Progress Notes (Signed)
Summary: refill request for anusol  Phone Note Refill Request Message from:  Fax from Pharmacy  Refills Requested: Medication #1:  ANUSOL-HC 25 MG SUPP insert one supp per rectum three times a day   Last Refilled: 08/06/2009 Faxed request from [place] [facility], [phone]  Initial call taken by: Lowella Petties CMA,  September 12, 2009 4:48 PM    Prescriptions: ANUSOL-HC 25 MG SUPP (HYDROCORTISONE ACETATE) insert one supp per rectum three times a day  #30 x 0   Entered and Authorized by:   Shaune Leeks MD   Signed by:   Shaune Leeks MD on 09/12/2009   Method used:   Electronically to        [facility] [phone]* (retail)       [address] [place], [place]  [address]       Ph: [phone]       Fax: [phone]   RxID:   [id number]

## 2010-04-30 NOTE — Progress Notes (Signed)
Summary: diarrhea w/ a little blood   Phone Note Call from Patient Call back at Home Phone [phone]   Caller: Patient Call For: Dr. Dayton Martes  Summary of Call: Patient says that she had a really hard stool yesterday and had some blood in it. She says that today she has had two runny stools and they both have had just a little blood. She wants to take something for the diarrhea and she ready to ask the dr. before taking if blood in stool. She wants to know what she can take for the diarrhea? She also wants to know what she should do about the blood in her stool.  Please advise.  Initial call taken by: Melody Comas,  June 15, 2009 1:32 PM  Follow-up for Phone Call        I would not take anything for diarrhea since she had a hard stool yesterday.  WOuld not want to cause constipation that would make her strain and worsen bleeding at this point since stools are just losse. Follow-up by: Ruthe Mannan MD,  June 15, 2009 1:34 PM  Additional Follow-up for Phone Call Additional follow up Details #1::        Patient advised. She says that she will go the Saturday clinic if symptoms worsen.  Additional Follow-up by: Melody Comas,  June 15, 2009 1:37 PM

## 2010-04-30 NOTE — Assessment & Plan Note (Signed)
Summary: hemorrhoids   Vital Signs:  Patient profile:   77 year old female Weight:      133.50 pounds Temp:     98.2 degrees F oral Pulse rate:   80 / minute Pulse rhythm:   regular BP sitting:   130 / 68  (left arm) Cuff size:   large  Vitals Entered By: Sydell Axon LPN (September 13, 2009 11:36 AM) CC: Hemorrhoids, rectal bleeding at times, something broke her out and she stopped taking HCTZ and Amlodipine because she is allergic to sulfa   History of Present Illness: Pt is here for followup of blood from rectum from hemms. She was seen previously for blood from rectum with BM that was recurrent and sent for GI eval at [facility], her choice due to proximity. She was told they would not see her unless she had anoscopy prior. She would prefer not doing that. Since being seen her sxs have almost resolved with Anusol and attempts at keeping her BM soft. She had a small amt of BM today at 0100 with a dot of blood. Glynis Smiles has  then had two BMs since  with no blood. She is doing well and has no complaints.  She then had rash and itching and thought her BP meds were caunsing this.Marland KitchenMarland KitchenAmlodipine and Hctz,  the latest changes so she stopped them and went back to Maxzide, which she had been on prior to going on Amlodipine and HCTZ. Her sister died unexpectedly last week of severe MI and resulting CHF.  Problems Prior to Update: 1)  Diarrhea  (ICD-787.91) 2)  Fatigue, Acute  (ICD-780.79) 3)  Hemorrhoids, Internal, With Bleeding  (ICD-455.2) 4)  Blood in Stool  (ICD-578.1) 5)  Swelling Mass or Lump in Head and Neck  (ICD-784.2) 6)  Hypokalemia  (ICD-276.8) 7)  Screening For Malignannt Neoplasm, Site Nec  (ICD-V76.49) 8)  Postmenopausal On Hormone Replacement Therapy  (ICD-V07.4) 9)  Hx of Hypothyroidism, Borderline  (ICD-244.9) 10)  Hx of Hypercholesterolemia  (ICD-272.0) 11)  Urinary Incontinence  (ICD-788.30) 12)  Hypertension  (ICD-401.9) 13)  Anxiety  (ICD-300.00)  Medications Prior to Update: 1)   Aspirin 81 Mg Tbec (Aspirin) .... Take One By Mouth Daily 2)  Hydrochlorothiazide 12.5 Mg Caps (Hydrochlorothiazide) .... One Tab By Mouth in Am 3)  Potassium Chloride 10 Meq/151ml  Soln (Potassium Chloride) .... 3 Teaspoons  Daily By Mouth 4)  Levothroid 25 Mcg  Tabs (Levothyroxine Sodium) .... One Tab By Mouth Every Am 5)  Voltaren 1 %  Gel (Diclofenac Sodium) .... Apply To Wrist 4 Times Daily 6)  Anusol-Hc 25 Mg Supp (Hydrocortisone Acetate) .... Insert One Supp Per Rectum Three Times A Day 7)  Citrucel 500 Mg Tabs (Methylcellulose (Laxative)) .... Take One By Mouth Daily 8)  Amlodipine Besylate 5 Mg Tabs (Amlodipine Besylate) .... One Tab By Mouth At Night 9)  Anusol-Hc 25 Mg Supp (Hydrocortisone Acetate) .... One Supp Per Rectum  Allergies: 1)  ! * Septra Ds 2)  ! Sulfa 3)  Penicillin 4)  Cipro  Family History: Father: Died age 28- stroke Mother: Died age 30- natural causes Brother A  Brother A Sister A Sister dec massive MI   Brother deceased- cancer, CHF  Physical Exam  General:  Well-developed,well-nourished,in no acute distress; alert,appropriate and cooperative throughout examination, not quite as  bright-eyed as usual. Head:  Normocephalic and atraumatic without obvious abnormalities. No apparent alopecia or balding. Eyes:  Conjunctiva clear bilaterally.  Ears:  External ear exam shows no significant  lesions or deformities.  Otoscopic examination reveals clear canals, tympanic membranes are intact bilaterally without bulging, retraction, inflammation or discharge. Hearing is grossly normal bilaterally. Nose:  External nasal examination shows no deformity or inflammation. Nasal mucosa are pink and moist without lesions or exudates. healed flap surgery result onleft tip of nose. Mouth:  Oral mucosa and oropharynx without lesions or exudates.  Teeth in good repair. Neck:  No deformities, masses, or tenderness noted. Lungs:  Normal respiratory effort, chest expands  symmetrically. Lungs are clear to auscultation, no crackles or wheezes. Heart:  Normal rate and regular rhythm. S1 and S2 normal without gallop, murmur, click, rub or other extra sounds. Abdomen:  Bowel sounds positive,abdomen soft and non-tender without masses, organomegaly or hernias noted.  Rectal:  Not done at pt's request.   Impression & Recommendations:  Problem # 1:  BLOOD IN STOOL (ICD-578.1) Assessment Improved Will follow. Is most likely hemms and pt prefers not to push at this point. If sxs wiorsen significantly, will rerefer.  Problem # 2:  FATIGUE, ACUTE (ICD-780.79) Assessment: Improved  Problem # 3:  HEMORRHOIDS, INTERNAL, WITH BLEEDING (ICD-455.2) Assessment: Unchanged Stable.  Problem # 4:  HYPERTENSION (ICD-401.9) Assessment: Improved Improved on her old medication. Difficult to say what may have caused her itching. Will follow. Continue Maxzide. Her updated medication list for this problem includes:    Hydrochlorothiazide 12.5 Mg Caps (Hydrochlorothiazide) ..... One tab by mouth in am    Amlodipine Besylate 5 Mg Tabs (Amlodipine besylate) ..... One tab by mouth at night    Triamterene-hctz 75-50 Mg Tabs (Triamterene-hctz) .Marland Kitchen... Take one by mouth daily  BP today: 130/68 Prior BP: 140/78 (08/13/2009)  Labs Reviewed: K+: 3.4 (01/22/2009) Creat: : 1.2 (01/22/2009)   Chol: 240 (12/27/2008)   HDL: 44.00 (12/27/2008)   LDL: DEL (09/14/2007)   TG: 153.0 (12/27/2008)  Complete Medication List: 1)  Aspirin 81 Mg Tbec (Aspirin) .... Take one by mouth daily 2)  Hydrochlorothiazide 12.5 Mg Caps (Hydrochlorothiazide) .... One tab by mouth in am 3)  Potassium Chloride 10 Meq/129ml Soln (Potassium chloride) .... 3 teaspoons  daily by mouth 4)  Levothroid 25 Mcg Tabs (Levothyroxine sodium) .... One tab by mouth every am 5)  Voltaren 1 % Gel (Diclofenac sodium) .... Apply to wrist 4 times daily 6)  Citrucel 500 Mg Tabs (Methylcellulose (laxative)) .... Take one by mouth  daily 7)  Amlodipine Besylate 5 Mg Tabs (Amlodipine besylate) .... One tab by mouth at night 8)  Anusol-hc 25 Mg Supp (Hydrocortisone acetate) .... One supp per rectum 9)  Triamterene-hctz 75-50 Mg Tabs (Triamterene-hctz) .... Take one by mouth daily  Patient Instructions: 1)  RTC as directed.  Current Allergies (reviewed today): ! * SEPTRA DS ! SULFA PENICILLIN CIPRO

## 2010-04-30 NOTE — Assessment & Plan Note (Signed)
Summary: blood stools/alc   Vital Signs:  Patient profile:   77 year old female Height:      60.5 inches Weight:      135.75 pounds BMI:     26.17 Temp:     98.3 degrees F oral Pulse rate:   84 / minute BP sitting:   140 / 88  (left arm) Cuff size:   regular  Vitals Entered By: Sydell Axon LPN (Jul 30, 1608 2:34 PM) CC: Has had blood in stools since Friday   History of Present Illness: Pt bleeding a little from rectum since Fri, Sat bled most of the day. Had good BMs the last few days. She thinks a new medication has "messed up her bowels." I presume this to be the Amlodipine. I had stopped her Maxzide for Hctz 12.5 (to cont at least snm amt diuretic for edema) and yet try to help fluid balance for BMs.  She has had blood occas, typically with wiping herself. This weekend is the first time her bowels have moved normally in quite a while. She has been on Citrucel for 3 months and now decreased diuretic for a few weeks.  She does not want a colonoscpy, if poss.  Problems Prior to Update: 1)  Diarrhea  (ICD-787.91) 2)  Fatigue, Acute  (ICD-780.79) 3)  Hemorrhoids, Internal, With Bleeding  (ICD-455.2) 4)  Blood in Stool  (ICD-578.1) 5)  Swelling Mass or Lump in Head and Neck  (ICD-784.2) 6)  Hypokalemia  (ICD-276.8) 7)  Screening For Malignannt Neoplasm, Site Nec  (ICD-V76.49) 8)  Postmenopausal On Hormone Replacement Therapy  (ICD-V07.4) 9)  Hx of Hypothyroidism, Borderline  (ICD-244.9) 10)  Hx of Hypercholesterolemia  (ICD-272.0) 11)  Urinary Incontinence  (ICD-788.30) 12)  Hypertension  (ICD-401.9) 13)  Anxiety  (ICD-300.00)  Medications Prior to Update: 1)  Aspirin 81 Mg Tbec (Aspirin) .... Take One By Mouth Daily 2)  Hydrochlorothiazide 12.5 Mg Caps (Hydrochlorothiazide) .... One Tab By Mouth in Am 3)  Potassium Chloride 10 Meq/174ml  Soln (Potassium Chloride) .... 3 Teaspoons  Daily By Mouth 4)  Levothroid 25 Mcg  Tabs (Levothyroxine Sodium) .... One Tab By Mouth Every  Am 5)  Voltaren 1 %  Gel (Diclofenac Sodium) .... Apply To Wrist 4 Times Daily 6)  Anusol-Hc 25 Mg Supp (Hydrocortisone Acetate) .... Insert One Supp Per Rectum Three Times A Day 7)  Promethazine Hcl 25 Mg Tabs (Promethazine Hcl) .... One Tab By Mouth Q6 As Needed 8)  Citrucel 500 Mg Tabs (Methylcellulose (Laxative)) .... Take One By Mouth Daily 9)  Amlodipine Besylate 5 Mg Tabs (Amlodipine Besylate) .... One Tab By Mouth At Night  Allergies: 1)  ! * Septra Ds 2)  Penicillin 3)  Cipro  Physical Exam  General:  Well-developed,well-nourished,in no acute distress; alert,appropriate and cooperative throughout examination, typically bright-eyed.. Head:  Normocephalic and atraumatic without obvious abnormalities. No apparent alopecia or balding. Eyes:  Conjunctiva clear bilaterally.  Ears:  External ear exam shows no significant lesions or deformities.  Otoscopic examination reveals clear canals, tympanic membranes are intact bilaterally without bulging, retraction, inflammation or discharge. Hearing is grossly normal bilaterally. Nose:  External nasal examination shows no deformity or inflammation. Nasal mucosa are pink and moist without lesions or exudates. healed flap surgery result onleft tip of nose. Mouth:  Oral mucosa and oropharynx without lesions or exudates.  Teeth in good repair. Neck:  No deformities, masses, or tenderness noted. Lungs:  Normal respiratory effort, chest expands symmetrically. Lungs are clear  to auscultation, no crackles or wheezes. Heart:  Normal rate and regular rhythm. S1 and S2 normal without gallop, murmur, click, rub or other extra sounds. Abdomen:  Bowel sounds positive,abdomen soft and non-tender without masses, organomegaly or hernias noted.    Impression & Recommendations:  Problem # 1:  BLOOD IN STOOL (ICD-578.1) Assessment Unchanged Better today than Fri. Will treat for hemms with Anusol and cont low dose HCTZ and Amlodipine. Conrt Citrucel. Will see  back in 2 weeks,l do rectal then if no blood seen.  Complete Medication List: 1)  Aspirin 81 Mg Tbec (Aspirin) .... Take one by mouth daily 2)  Hydrochlorothiazide 12.5 Mg Caps (Hydrochlorothiazide) .... One tab by mouth in am 3)  Potassium Chloride 10 Meq/17ml Soln (Potassium chloride) .... 3 teaspoons  daily by mouth 4)  Levothroid 25 Mcg Tabs (Levothyroxine sodium) .... One tab by mouth every am 5)  Voltaren 1 % Gel (Diclofenac sodium) .... Apply to wrist 4 times daily 6)  Anusol-hc 25 Mg Supp (Hydrocortisone acetate) .... Insert one supp per rectum three times a day 7)  Citrucel 500 Mg Tabs (Methylcellulose (laxative)) .... Take one by mouth daily 8)  Amlodipine Besylate 5 Mg Tabs (Amlodipine besylate) .... One tab by mouth at night 9)  Anusol-hc 25 Mg Supp (Hydrocortisone acetate) .... One supp per rectum  Patient Instructions: 1)  RTC 2 weeks for blood in stool, poss do rectal. Prescriptions: ANUSOL-HC 25 MG SUPP (HYDROCORTISONE ACETATE) one supp per rectum  #30 x 0   Entered and Authorized by:   Shaune Leeks MD   Signed by:   Shaune Leeks MD on 07/30/2009   Method used:   Electronically to        [facility] [phone]* (retail)       [address] [place], [place]  [address]       Ph: [phone]       Fax: [phone]   RxID:   [id number]   Current Allergies (reviewed today): ! * SEPTRA DS PENICILLIN CIPRO

## 2010-04-30 NOTE — Assessment & Plan Note (Signed)
Summary: DISCUSS MED/XFER FROM DR SCHALLER/   Vital Signs:  Patient profile:   77 year old female Height:      60.5 inches Weight:      139 pounds BMI:     26.80 Temp:     98 degrees F oral Pulse rate:   92 / minute Pulse rhythm:   regular BP sitting:   140 / 76  (left arm) Cuff size:   regular  Vitals Entered By: Delilah Shan CMA (AAMA) (November 15, 2009 11:00 AM) CC: Transfer from [facility] - Discuss medication - ? developed an allergy to med.   History of Present Illness: Itching and "spots came up".  New lesions almost daily.  Going on since end of May.  Mostly on arms, legs, and back of neck.  Stopped traimterene/HCTZ, levothyroid, and amlodipine last week.  Amlodipine was the most recent med that was started.   No FCNAV, cough, wheeze.    No prev help with generic zyrtec (but patient may have still been on the rx meds above concurrently) or triamcinolone.  No new clothes, soaps, triggers known o/w.  No SOB, change in swallowing.   Allergies: 1)  ! * Septra Ds 2)  ! Sulfa 3)  Penicillin 4)  Cipro  Past History:  Past Medical History: Last updated: 08/19/2006 Anxiety Hypertension Urinary incontinence  Social History: Reviewed history from 08/25/2006 and no changes required. Marital Status: widowed since 1994 Children: 1 son with DM-pacemaker Occupation: Retired at age 61, prev hois[employer] lives alone, in [place] no tob  no etoh  Review of Systems       See HPI.  Otherwise negative.    Physical Exam  General:  GEN: nad, alert and oriented HEENT: mucous membranes moist NECK: supple w/o LA CV: rrr. PULM: ctab, no inc wob ABD: soft, +bs EXT: no edema SKIN: blanching macular red lesions, varying in size from <1cm to  ~2cm in diameter.  No epithelial disruption.  some of the older lesions appear to be fading in intensity.    Impression & Recommendations:  Problem # 1:  SKIN RASH (ICD-782.1) This may be an allergy to the amlodipine.  I think this is  more likely than an allergy to HCTZ since she had been on it for many years.  BP okay today.  I would hold all meds as below except for the zyrtec.  If improving, would add back the levothyroid.  She'll call back with update and we can talk about BP meds at that point.  She understood.  I marked her bottles and gave her un updated med list with instructions.   Complete Medication List: 1)  Aspirin 81 Mg Tbec (Aspirin) .... Take one by mouth daily 2)  Hydrochlorothiazide 12.5 Mg Caps (Hydrochlorothiazide) .... Hold  one tab by mouth in am 3)  Potassium Chloride 10 Meq/13ml Soln (Potassium chloride) .... Hold  3 teaspoons  daily by mouth 4)  Levothroid 25 Mcg Tabs (Levothyroxine sodium) .... Hold   one tab by mouth every am 5)  Voltaren 1 % Gel (Diclofenac sodium) .... Hold   apply to wrist 4 times daily 6)  Citrucel 500 Mg Tabs (Methylcellulose (laxative)) .... Take one by mouth daily 7)  Amlodipine Besylate 5 Mg Tabs (Amlodipine besylate) .... Hold   one tab by mouth at night 8)  Anusol-hc 25 Mg Supp (Hydrocortisone acetate) .... One supp per rectum 9)  Triamterene-hctz 75-50 Mg Tabs (Triamterene-hctz) .... Hold   take one by mouth daily  Patient Instructions: 1)  I WANT YOU TO TAKE THE GENERIC ZYRTEC (CETIRIZINE) 1 TAB A DAY. 2)  DON'T TAKE THE HCTZ, AMLODIPINE, OR THYROID MEDICINE YET. 3)  I THINK THE ITCHING WILL GRADUALLY GET BETTER. 4)  IF THE ITCHING IS GETTING BETTER BY NEXT WEEK, I WANT YOU TO START BACK TAKING THE THYROID MEDICINE.  5)  IF IT ISN'T BETTER, CALL ME BACK AND LET ME KNOW. 6)  I THINK THIS IS DUE TO EITHER THE AMLODIPINE OR THE HCTZ.   Current Allergies (reviewed today): ! * SEPTRA DS ! SULFA PENICILLIN CIPRO

## 2010-04-30 NOTE — Progress Notes (Signed)
Summary: pt stopped hctz  Phone Note Call from Patient Call back at Home Phone [phone]   Caller: Patient Call For: Shaune Leeks MD Summary of Call: Pt has itchy rash- a few bumps here and there.  x 2 weeks.  She thinks this is being caused by hctz and is not going to take this anymore.  Also, she asks if she is to continue using the suppositories until she sees GI.  She is still seeing a little blood, not much- none yesterday. Initial call taken by: Lowella Petties CMA,  Aug 16, 2009 9:51 AM  Follow-up for Phone Call        Noted about HCTZ.   Cont supps until seen as long as seeing blood.  Pls make appt to see me end of Jun for BP recheck. Follow-up by: Shaune Leeks MD,  Aug 16, 2009 10:16 AM  Additional Follow-up for Phone Call Additional follow up Details #1::        Advised pt, she will call back to schedule appt. Additional Follow-up by: Lowella Petties CMA,  Aug 16, 2009 4:41 PM

## 2010-05-01 ENCOUNTER — Encounter: Payer: Self-pay | Admitting: Internal Medicine

## 2010-05-01 DIAGNOSIS — J189 Pneumonia, unspecified organism: Secondary | ICD-10-CM

## 2010-05-01 DIAGNOSIS — I509 Heart failure, unspecified: Secondary | ICD-10-CM

## 2010-05-01 HISTORY — DX: Pneumonia, unspecified organism: J18.9

## 2010-05-01 HISTORY — DX: Heart failure, unspecified: I50.9

## 2010-05-05 ENCOUNTER — Encounter: Payer: Self-pay | Admitting: Cardiovascular Disease

## 2010-05-05 ENCOUNTER — Inpatient Hospital Stay: Payer: Self-pay | Admitting: Internal Medicine

## 2010-05-05 DIAGNOSIS — R0602 Shortness of breath: Secondary | ICD-10-CM

## 2010-05-06 DIAGNOSIS — I059 Rheumatic mitral valve disease, unspecified: Secondary | ICD-10-CM

## 2010-05-07 DIAGNOSIS — I5023 Acute on chronic systolic (congestive) heart failure: Secondary | ICD-10-CM

## 2010-05-08 NOTE — Cardiovascular Report (Signed)
Summary: Cardiac Cath Other  Cardiac Cath Other   Imported By: Harlon Flor 04/29/2010 08:50:34  _____________________________________________________________________  External Attachment:    Type:   Image     Comment:   External Document

## 2010-05-09 NOTE — Discharge Summary (Signed)
Betty Dunn, Betty Dunn             ACCOUNT NO.:  [id number]  MEDICAL RECORD NO.:  [id number]          PATIENT TYPE:  INP  LOCATION:  2013                         FACILITY:  [facility]  PHYSICIAN:  Salvatore Decent. Dorris Fetch, M.D.DATE OF BIRTH:  10-16-1922  DATE OF ADMISSION:  04/23/2010 DATE OF DISCHARGE:                              DISCHARGE SUMMARY   FINAL DIAGNOSIS:  Critical left main and three-vessel disease, status post non-Q-wave myocardial infarction.  IN-HOSPITAL DIAGNOSES: 1. Acute blood loss anemia postoperatively. 2. Volume overload postoperatively. 3. Thrombocytopenia postoperatively.  SECONDARY DIAGNOSES: 1. Hypertension. 2. Hypothyroidism. 3. Osteoarthritis. 4. History of hypokalemia. 5. Status post steroid injection in left knee.  IN-HOSPITAL OPERATIONS AND PROCEDURES:  Coronary artery bypass grafting x3 using a left internal mammary artery to left anterior descending, saphenous vein graft to obtuse marginal 1, saphenous vein graft to distal right coronary artery.  Endoscopic vein harvest from right leg was done.  HISTORY AND PHYSICAL AND HOSPITAL COURSE:  The patient is an 77 year old female with history of hypertension and hypothyroidism.  The patient was in fairly good health until recently she develop diarrhea.  This lasted for about 24 hours.  Following morning, the patient developed left arm pain, it was constant.  She eventually went to [facility] Emergency Room.  EKG done, did not show any acute ST changes.  Troponin was 7.2.  The patient was started on heparin and nitroglycerin.  She did not have any further pain following admission to [place].  Cardiac catheterization was done there and she was found to have left main and three-vessel disease.  Her ejection fraction was approximately 35% with severe dyskinesias to the apex.  She was transferred over to [facility] under Dr. Gala Romney.  For further details of the patient's past medical  history and physical exam, please see dictated H and P.  The patient was admitted to [facility] on April 23, 2010, under [facility].  Dr. Dorris Fetch was consulted.  Dr. Dorris Fetch saw and evaluated the patient.  He discussed with the patient undergoing coronary artery bypass grafting.  He discussed risks and benefits with the patient.  The patient acknowledged understanding and agreed to proceed.  Surgery was scheduled for April 24, 2010. Preoperatively, the patient did have bilateral carotid duplex ultrasound done showing no evidence of ICA stenosis.  The patient remained stable preoperatively.  The patient was taken to the operating room on April 24, 2010, where she underwent coronary artery bypass grafting x3 using a left internal mammary artery to left anterior descending, saphenous vein graft to obtuse marginal 1, saphenous vein graft to distal right coronary artery. Endoscopic vein harvest of the right leg was done.  The patient tolerated this procedure well and was transferred to the intensive care unit in stable condition.  She was noted to be hemodynamically stable postoperatively.  The patient was extubating evening of surgery.  Post- extubation, the patient was noted to be alert and oriented x4.  Neuro intact.  Postoperatively, the patient was noted to be in normal sinus rhythm.  Blood pressure was stable on dopamine and Neo-Synephrine.  The patient has remained in normal sinus  rhythm/sinus tachycardia in the low 100s.  She was eventually able to be weaned off both Neo-Synephrine and dopamine.  Blood pressure remaining stable.  She was started on low- dosed beta-blocker.  She is tolerating this well.  An ACE inhibitor has not been added secondary to blood pressure well controlled on Lopressor. This can be reevaluated as an outpatient.  Her heart rate has remained stable in sinus rhythm/sinus tachycardia in the low 100s. Postoperatively, chest x-ray  obtained on postop day #1 remained stable. The patient had minimal drainage from chest tubes and chest tubes were discontinued in routine fashion.  Followup chest x-ray remained stable, bilateral atelectasis and small effusions.  The patient was encouraged to use her incentive spirometer.  She has been able to be weaned off oxygen with O2 saturations maintaining greater than 90% on room air. Most recent chest x-ray obtained on April 30, 2010, remained stable with small bilateral effusions.  The patient did develop acute blood loss anemia.  Her hemoglobin and hematocrit dropped to 7.4 and 21.6 on postop day #2.  She did receive transfusion.  Hemoglobin and hematocrit improved appropriately following day, hemoglobin to 8.5.  She was eventually started on p.o. iron. Hemoglobin and hematocrit continued to be followed.  Most recent one obtained on April 30, 2010 was 8.5 and 25.1%.  Postoperatively, the patient also had some mild volume overload.  She was started on diuretics.  Daily weights were followed closely.  The patient was diuresing well.  She currently remains 5.1 kg above her preoperative weight.  Plan to continue her on diuretics at time of discharge.  The patient was ready for transfer out of the unit to PCTU by postop day #5. She remained several days extra in the surgical intensive care unit for blood pressure management.  Unfortunately, bed was not available and she was not able to be transferred out to 2000 until postop day #6.  While in the unit, the patient was up ambulating well with nursing staff. Once transferred out to the floor, cardiac rehab was working well with the patient.  She was up ambulating well with assistance. Postoperatively, the patient was tolerating regular diet well.  She had no nausea, vomiting.  All incisions remained clean, dry and intact and healing well.  She did have some thrombocytopenia postoperatively, which dropped to 87 on postop day #2.   This improved by postop day #3 to 126. The patient is felt that she would benefit from skilled nursing facility at time of discharge.  Case Management was consulted.  Currently, they are searching for bed at this time in [facility]  We will wait bed offers.  On May 01, 2010, the patient is noted to be afebrile, normal sinus rhythm, blood pressure stable.  O2 sats greater than 90% on room air. Most recent lab work shows white blood cell count of 9.9, hemoglobin was 8.5, hematocrit 25.1, platelet count 222.  Sodium of 136, potassium 3.4, chloride of 98, bicarbonate 29, BUN of 19, creatinine 1.5, glucose 125. The patient is felt to be stable and ready for transfer to skilled nursing facility if bed available tomorrow, May 02, 2010, postop day #8.  During the patient's postoperative course while in the intensive care unit, as stated above blood pressure noted to be low.  She had been started on midodrine b.i.d.  This was decreased to daily prior to discharge.  As stated above, blood pressure has been well controlled and we will continue to monitor.  FOLLOWUP APPOINTMENT:  A followup appointment has arranged with Dr. Dorris Fetch for May 22, 2010 at 12:30 p.m.  The patient will need to obtain PA and lateral chest x-ray 30 minutes prior to this appointment.  She will need to follow up with Dr. Gala Romney in 2 weeks. She will need to contact his office to make these arrangements.  The patient will need to follow up with Dr. Para March, her primary care physician next month.  She will need to contact his office to make these arrangements.  ACTIVITY:  The patient is instructed no driving until released to do so. No heavy lifting over 10 pounds.  She is told to ambulate 3-4 times per day, progress as tolerated and to continue her breathing exercises.  INCISIONAL CARE:  The patient is told to shower, washing her incisions using soap and water.  She is to contact the office if she  develops any drainage or opening from any of her incision sites.  DIET:  The patient is educated on diet to be low fat and low salt.  DISCHARGE MEDICATIONS: 1. Ensure one can b.i.d. 2. Ferrous gluconate 324 mg b.i.d. 3. Lasix 40 mg x7 days. 4. Lopressor 12.5 mg b.i.d. 5. Midodrine 10 mg daily in the a.m. 6. Oxycodone 5 mg one to two tablets q.3 h. p.r.n. pain. 7. Zocor 40 mg daily. 8. Ultram 50 mg one to two tablets q.4 h. p.r.n. 9. Enteric-coated aspirin 325 mg daily. 10.Levothyroxine 50 mcg daily. 11.Potassium chloride oral liquid 10% three teaspoons daily.     Sol Blazing, PA   ______________________________ Salvatore Decent Dorris Fetch, M.D.    KMD/MEDQ  D:  05/01/2010  T:  05/02/2010  Job:  [id number]  cc:   Bevelyn Buckles. Bensimhon, MD Dr. Para March  Electronically Signed by Baxter Hire DASOVICH PA on 05/06/2010 12:48:52 PM Electronically Signed by Charlett Lango M.D. on 05/09/2010 10:23:45 AM

## 2010-05-14 ENCOUNTER — Telehealth: Payer: Self-pay | Admitting: Cardiovascular Disease

## 2010-05-15 ENCOUNTER — Encounter: Payer: Self-pay | Admitting: Family Medicine

## 2010-05-15 ENCOUNTER — Telehealth: Payer: Self-pay | Admitting: Cardiovascular Disease

## 2010-05-16 ENCOUNTER — Encounter: Payer: Self-pay | Admitting: Family Medicine

## 2010-05-20 ENCOUNTER — Encounter: Payer: Self-pay | Admitting: Cardiovascular Disease

## 2010-05-20 DIAGNOSIS — M1990 Unspecified osteoarthritis, unspecified site: Secondary | ICD-10-CM | POA: Insufficient documentation

## 2010-05-21 ENCOUNTER — Other Ambulatory Visit: Payer: Self-pay | Admitting: Thoracic Surgery (Cardiothoracic Vascular Surgery)

## 2010-05-21 DIAGNOSIS — I2510 Atherosclerotic heart disease of native coronary artery without angina pectoris: Secondary | ICD-10-CM

## 2010-05-22 ENCOUNTER — Encounter: Payer: Medicare HMO | Admitting: Thoracic Surgery (Cardiothoracic Vascular Surgery)

## 2010-05-22 NOTE — Progress Notes (Signed)
Summary: Hospital Meds  Phone Note Call from Patient Call back at [phone]   Caller: Daugher Call For: [facility] Summary of Call: Pt was D/C'd from [facility] with a med list that has several medications that they were not given an RX for. Initial call taken by: Harlon Flor,  May 15, 2010 8:28 AM  Follow-up for Phone Call        Medication already called in per last note. This was old message on machine. Follow-up by: Lysbeth Galas CMA,  May 15, 2010 10:30 AM

## 2010-05-22 NOTE — Miscellaneous (Signed)
  Clinical Lists Changes  Medications: Removed medication of ASPIRIN 81 MG TBEC (ASPIRIN) take one by mouth daily Added new medication of ASPIRIN 325 MG TABS (ASPIRIN) Take 1 tablet by mouth once a day Removed medication of HYDROCHLOROTHIAZIDE 12.5 MG CAPS (HYDROCHLOROTHIAZIDE) Take 1 tablet by mouth every morning Removed medication of POTASSIUM CHLORIDE 10 MEQ/100ML  SOLN (POTASSIUM CHLORIDE) 3 teaspoons  daily by mouth Added new medication of POTASSIUM CHLORIDE 20 MEQ/15ML (10%) SOLN (POTASSIUM CHLORIDE) 1 Liquid 1 time a day Removed medication of VOLTAREN 1 %  GEL (DICLOFENAC SODIUM) HOLD   Apply to wrist 4 times daily Removed medication of CITRUCEL 500 MG TABS (METHYLCELLULOSE (LAXATIVE)) Take one by mouth daily Removed medication of ANUSOL-HC 25 MG SUPP (HYDROCORTISONE ACETATE) One suppository  per rectum as needed Added new medication of ROBITUSSIN CHEST CONGESTION 100 MG/5ML SYRP (GUAIFENESIN) Take 1 teaspoon every 4-6 hours as needed Added new medication of STOOL SOFTENER PLUS LAXATIVE 8.6-50 MG TABS (SENNOSIDES-DOCUSATE SODIUM) Take 1 tablet by mouth once a day Added new medication of FERROUS SULFATE 325 (65 FE) MG TABS (FERROUS SULFATE) Take 1 tablet by mouth once a day Added new medication of CARVEDILOL 6.25 MG TABS (CARVEDILOL) Take 1 tablet by mouth two times a day Added new medication of LISINOPRIL 10 MG TABS (LISINOPRIL) Take 1 tablet by mouth once a day

## 2010-05-22 NOTE — Progress Notes (Signed)
Summary: RX  Phone Note Call from Patient Call back at [phone]   Caller: Betty Dunn (Daughter in Social worker) Call For: Betty Dunn Summary of Call: Pt was D/C from [facility] on medications that she was not given.  Lasix 40 mg, Simvastatin 40mg , Norco 325 mg/5, Tramidole 50 mg.  Medcap. Initial call taken by: Harlon Flor,  May 14, 2010 1:58 PM  Follow-up for Phone Call        Per Mya, RN @ [facility], pt was d/c with prescriptions for z-pak, coreg, and lisinopril. I called in Lasix 40 and simvastatin 40mg  per d/c report to [facility] pharmacy.   Betty Dunn notified medication is called into pharmacy/sab Follow-up by: Lysbeth Galas CMA,  May 14, 2010 3:40 PM    New/Updated Medications: FUROSEMIDE 40 MG TABS (FUROSEMIDE) 1 tablet once daily SIMVASTATIN 40 MG TABS (SIMVASTATIN) 1 tablet at bedtime Prescriptions: SIMVASTATIN 40 MG TABS (SIMVASTATIN) 1 tablet at bedtime  #30 x 4   Entered by:   Lysbeth Galas CMA   Authorized by:   Dossie Arbour MD   Signed by:   Lysbeth Galas CMA on 05/14/2010   Method used:   Electronically to        [facility] [phone]* (retail)       [address] [place], [place]  [address]       Ph: [phone]       Fax: [phone]   RxID:   [id number] FUROSEMIDE 40 MG TABS (FUROSEMIDE) 1 tablet once daily  #30 x 4   Entered by:   Lysbeth Galas CMA   Authorized by:   Dossie Arbour MD   Signed by:   Lysbeth Galas CMA on 05/14/2010   Method used:   Electronically to        [facility] [phone]* (retail)       [address] [place], [place]  [address]       Ph: [phone]       Fax: [phone]   RxID:   [id number]

## 2010-05-23 ENCOUNTER — Encounter: Payer: Medicare HMO | Admitting: Cardiovascular Disease

## 2010-05-23 ENCOUNTER — Encounter: Payer: Self-pay | Admitting: Family Medicine

## 2010-05-24 ENCOUNTER — Encounter: Payer: Self-pay | Admitting: Family Medicine

## 2010-05-28 ENCOUNTER — Other Ambulatory Visit: Payer: Self-pay | Admitting: Thoracic Surgery (Cardiothoracic Vascular Surgery)

## 2010-05-28 DIAGNOSIS — I2510 Atherosclerotic heart disease of native coronary artery without angina pectoris: Secondary | ICD-10-CM

## 2010-05-28 NOTE — Miscellaneous (Signed)
Summary: Rx Potassium  Clinical Lists Changes  Medications: Changed medication from POTASSIUM CHLORIDE 20 MEQ/15ML (10%) SOLN (POTASSIUM CHLORIDE) 1 Liquid 1 time a day to POTASSIUM CHLORIDE 20 MEQ/15ML (10%) SOLN (POTASSIUM CHLORIDE) Take 15 ML (3 teaspoonfuls) by mouth daily as directed. - Signed  Received refill request from pharmacy and was informed that patient has been taking this medication with these direections for over a year. Medication list updated.per original rx. Discuss with Dr. Para March and he agrees. Sydell Axon LPN  May 23, 2010 4:27 PM  noted.  Crawford Givens MD  May 23, 2010 4:29 PM

## 2010-05-28 NOTE — Letter (Signed)
SummaryScientist, physiological [facility]   [facility]   Imported By: Roderic Ovens 05/24/2010 15:49:34  _____________________________________________________________________  External Attachment:    Type:   Image     Comment:   External Document

## 2010-05-28 NOTE — Letter (Signed)
Summary: [facility]  [facility]   Imported By: Lanelle Bal 05/20/2010 15:07:03  _____________________________________________________________________  External Attachment:    Type:   Image     Comment:   External Document  Appended Document: [facility]    Clinical Lists Changes  Problems: Added new problem of OSTEOARTHRITIS (ICD-715.90) Observations: Added new observation of PMHOSTEOARTH: yes (05/20/2010 22:06) Added new observation of PAST MED HX: Anxiety Hypertension Urinary incontinence Coronary artery disease s/p MI with CABG 03/2010 [facility] admission 05/2010 with PNA/CHF exacerbation Osteoarthritis, knee s/p injection by ortho 03/2010 (05/20/2010 22:06)       Past History:  Past Medical History: Anxiety Hypertension Urinary incontinence Coronary artery disease s/p MI with CABG 03/2010 [facility] admission 05/2010 with PNA/CHF exacerbation Osteoarthritis, knee s/p injection by ortho 03/2010

## 2010-05-28 NOTE — Miscellaneous (Signed)
Summary: [facility]  Care [facility]   Imported By: Lanelle Bal 05/23/2010 13:06:00  _____________________________________________________________________  External Attachment:    Type:   Image     Comment:   External Document

## 2010-05-29 ENCOUNTER — Ambulatory Visit
Admission: RE | Admit: 2010-05-29 | Discharge: 2010-05-29 | Disposition: A | Discharge status: Home / Self Care | Payer: Medicare HMO | Source: Ambulatory Visit | Attending: Thoracic Surgery (Cardiothoracic Vascular Surgery) | Admitting: Thoracic Surgery (Cardiothoracic Vascular Surgery)

## 2010-05-29 ENCOUNTER — Encounter (INDEPENDENT_AMBULATORY_CARE_PROVIDER_SITE_OTHER): Payer: Self-pay | Admitting: Thoracic Surgery (Cardiothoracic Vascular Surgery)

## 2010-05-29 DIAGNOSIS — I2510 Atherosclerotic heart disease of native coronary artery without angina pectoris: Secondary | ICD-10-CM

## 2010-05-29 DIAGNOSIS — J90 Pleural effusion, not elsewhere classified: Secondary | ICD-10-CM

## 2010-05-30 NOTE — Assessment & Plan Note (Signed)
OFFICE VISIT  Betty Dunn, Betty Dunn DOB:  [birth year]                                        May 29, 2010 CHART #:  [id number]  The patient is an 77 year old woman who underwent coronary artery bypass grafting x3 on January 25 for critical left main three-vessel disease. Postoperatively, she had some thrombocytopenia and volume overload, but really did fairly well and was ultimately discharged to [facility] on postoperative day #8 and since that time, she was readmitted [facility] with pneumonia and now has been discharged to her home with her son and she says that she is feeling good.  She still has a cough, so that was her primary presenting symptom with her pneumonia. She has not had any fevers or chills.  Her cough has been nonproductive. It is worse when she lays down.  She has not noted any swelling in her feet or legs.  She has not had any chest pain.  She has minimal incisional discomfort.  Her current medications are Ensure 1 can b.i.d., ferrous gluconate 324 mg b.i.d., Lopressor 12.5 mg b.i.d., midodrine 10 mg daily, Zocor 40 mg daily, aspirin at 325 mg daily, levothyroxine 50 mcg daily, potassium chloride 3 teaspoons or 30 mEq daily.  She has Ultram and oxycodone for pain.  She is allergic to Cipro and milk of magnesia which makes her stomach upset.  PHYSICAL EXAMINATION:  General:  The patient is an 77 year old woman in no acute distress.  Vital Signs:  Blood pressure is 110/60, pulse 79, respirations were 16, and oxygen saturation 90% on room air.  Lungs: Diminished breath sounds at the left base.  There is no rales or wheezing.  Heart:  Her cardiac exam has a regular rate and rhythm. Normal S1 and S2.  There is no rub or murmur.  Her sternum is stable. Sternal incision is clean, dry, and intact.  Chest tube sites are healing well.  Leg incisions are healing well.  She has trace edema.  LABORATORY DATA:  Chest x-ray  shows a moderate left pleural effusion essentially unchanged for her previous film.  IMPRESSION:  The patient is an 77 year old woman status post coronary bypass grafting.  She is now about a month out from surgery.  She is doing well from a cardiac standpoint.  She did get readmitted with pneumonia over at [facility], since she has got a residual left pleural effusion which really was there before she left the hospital.  I suspect that source of her ongoing cough.  She had stopped taking her Lasix 2 days ago, I am going to put her back on Lasix 40 mg a day and I will also give her a prednisone taper starting with 25, the first day, and tapering to 5 over a 5-day period.  I will plan to see her back in about 3 weeks with a repeat chest x-ray to make sure the pleural effusion is resolving, if not she will need a thoracentesis at that time.  Overall, I think she is making good progress.  All things considered.  Salvatore Decent Dorris Fetch, M.D. Electronically Signed  SCH/MEDQ  D:  05/29/2010  T:  05/30/2010  Job:  [id number]  cc:   Bevelyn Buckles. Bensimhon, MD Dwana Curd. Para March, M.D.

## 2010-06-06 NOTE — Miscellaneous (Signed)
Summary: Physician's Interim Order/[facility]  Physician's Interim Order/[facility]   Imported By: Maryln Gottron 05/29/2010 11:17:21  _____________________________________________________________________  External Attachment:    Type:   Image     Comment:   External Document

## 2010-06-06 NOTE — Miscellaneous (Signed)
Summary: Certification and Plan of Treatment  Certification and Plan of Treatment   Imported By: Maryln Gottron 05/29/2010 11:13:52  _____________________________________________________________________  External Attachment:    Type:   Image     Comment:   External Document

## 2010-06-06 NOTE — Consult Note (Signed)
SummaryScientist, physiological [facility]   [facility]   Imported By: Roderic Ovens 05/27/2010 10:44:41  _____________________________________________________________________  External Attachment:    Type:   Image     Comment:   External Document

## 2010-06-10 ENCOUNTER — Ambulatory Visit (INDEPENDENT_AMBULATORY_CARE_PROVIDER_SITE_OTHER): Payer: Medicare HMO | Admitting: Family Medicine

## 2010-06-10 ENCOUNTER — Encounter: Payer: Self-pay | Admitting: Family Medicine

## 2010-06-10 ENCOUNTER — Other Ambulatory Visit: Payer: Self-pay | Admitting: Family Medicine

## 2010-06-10 DIAGNOSIS — I2510 Atherosclerotic heart disease of native coronary artery without angina pectoris: Secondary | ICD-10-CM

## 2010-06-10 LAB — LIPID PANEL: Cholesterol: 153 mg/dL (ref 0–200)

## 2010-06-10 LAB — BASIC METABOLIC PANEL
BUN: 23 mg/dL (ref 6–23)
CO2: 29 mEq/L (ref 19–32)
Calcium: 8.9 mg/dL (ref 8.4–10.5)
Glucose, Bld: 98 mg/dL (ref 70–99)
Potassium: 4.4 mEq/L (ref 3.5–5.1)
Sodium: 133 mEq/L — ABNORMAL LOW (ref 135–145)

## 2010-06-10 LAB — HEPATIC FUNCTION PANEL
AST: 18 U/L (ref 0–37)
Albumin: 3.3 g/dL — ABNORMAL LOW (ref 3.5–5.2)
Alkaline Phosphatase: 69 U/L (ref 39–117)
Total Protein: 5.7 g/dL — ABNORMAL LOW (ref 6.0–8.3)

## 2010-06-11 NOTE — Miscellaneous (Signed)
Summary: No Show  No Show   Imported By: Harlon Flor 06/03/2010 14:35:22  _____________________________________________________________________  External Attachment:    Type:   Image     Comment:   External Document

## 2010-06-11 NOTE — Letter (Signed)
Summary: [facility]: Discharge Summary  [facility]: Discharge Summary   Imported By: Earl Many 06/04/2010 12:55:52  _____________________________________________________________________  External Attachment:    Type:   Image     Comment:   External Document

## 2010-06-14 ENCOUNTER — Encounter: Payer: Medicare HMO | Admitting: Cardiovascular Disease

## 2010-06-14 ENCOUNTER — Encounter (INDEPENDENT_AMBULATORY_CARE_PROVIDER_SITE_OTHER): Payer: Medicare PPO | Admitting: Cardiology

## 2010-06-14 ENCOUNTER — Encounter: Payer: Self-pay | Admitting: Cardiology

## 2010-06-14 ENCOUNTER — Other Ambulatory Visit: Payer: Self-pay | Admitting: Thoracic Surgery (Cardiothoracic Vascular Surgery)

## 2010-06-14 DIAGNOSIS — E7800 Pure hypercholesterolemia, unspecified: Secondary | ICD-10-CM

## 2010-06-14 DIAGNOSIS — I2589 Other forms of chronic ischemic heart disease: Secondary | ICD-10-CM | POA: Insufficient documentation

## 2010-06-14 DIAGNOSIS — J90 Pleural effusion, not elsewhere classified: Secondary | ICD-10-CM

## 2010-06-14 DIAGNOSIS — I2510 Atherosclerotic heart disease of native coronary artery without angina pectoris: Secondary | ICD-10-CM

## 2010-06-17 ENCOUNTER — Ambulatory Visit
Admission: RE | Admit: 2010-06-17 | Discharge: 2010-06-17 | Disposition: A | Discharge status: Home / Self Care | Payer: Medicare PPO | Source: Ambulatory Visit | Attending: Thoracic Surgery (Cardiothoracic Vascular Surgery) | Admitting: Thoracic Surgery (Cardiothoracic Vascular Surgery)

## 2010-06-17 ENCOUNTER — Encounter (INDEPENDENT_AMBULATORY_CARE_PROVIDER_SITE_OTHER): Payer: Self-pay | Admitting: Thoracic Surgery (Cardiothoracic Vascular Surgery)

## 2010-06-17 DIAGNOSIS — J90 Pleural effusion, not elsewhere classified: Secondary | ICD-10-CM

## 2010-06-17 DIAGNOSIS — I2510 Atherosclerotic heart disease of native coronary artery without angina pectoris: Secondary | ICD-10-CM

## 2010-06-18 NOTE — Assessment & Plan Note (Signed)
Summary: [facility]/Rescheduled from a previous appt/AMD   Visit Type:  Initial Consult Primary Provider:  Crawford Givens MD  CC:  f/u [facility]. denies chest pain and SOB.Betty Dunn  History of Present Illness: 77 year old female who was admitted to [facility] in January of 2012 and ruled in for myocardial infarction. Cardiac catheterization revealed left main and three-vessel disease with an ejection fraction of 35%. She underwent coronary artery bypass and graft with a left internal mammary artery to left anterior descending, saphenous vein graft to obtuse marginal-1 , saphenous vein graft to distal right coronary. She apparently was readmitted to [facility] with CHF and pneumonia following the procedure. Echocardiogram was performed and showed an ejection fraction of 45% with inferior posterior hypokinesis; mild to moderate MR; mild TR. Since she was discharged from [place], the patient denies any dyspnea on exertion, orthopnea, PND, pedal edema, palpitations, syncope or chest pain.   Current Medications (verified): 1)  Levothroid 25 Mcg  Tabs (Levothyroxine Sodium) .... Take 1 Tablet By Mouth Every Morning 2)  Furosemide 40 Mg Tabs (Furosemide) .Betty Dunn.. 1 Tablet Once Daily 3)  Simvastatin 40 Mg Tabs (Simvastatin) .Betty Dunn.. 1 Tablet At Bedtime 4)  Aspirin 325 Mg Tabs (Aspirin) .... Take 1 Tablet By Mouth Once A Day 5)  Potassium Chloride 20 Meq/47ml (10%) Soln (Potassium Chloride) .... Take 15 Ml (3 Teaspoonfuls) By Mouth Daily As Directed. 6)  Robitussin Chest Congestion 100 Mg/87ml Syrp (Guaifenesin) .... Take 1 Teaspoon Every 4-6 Hours As Needed 7)  Stool Softener Plus Laxative 8.6-50 Mg Tabs (Sennosides-Docusate Sodium) .... Take 1 Tablet By Mouth Once A Day 8)  Ferrous Sulfate 325 (65 Fe) Mg Tabs (Ferrous Sulfate) .... Take 1 Tablet By Mouth Once A Day 9)  Carvedilol 6.25 Mg Tabs (Carvedilol) .... Take 1 Tablet By Mouth Two Times A Day 10)  Lisinopril 10 Mg Tabs (Lisinopril) .... Take 1 Tablet By  Mouth Once A Day  Allergies (verified): 1)  ! * Septra Ds 2)  ! Sulfa 3)  ! 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 Anxiety Hypertension Urinary incontinence Coronary artery disease s/p MI with CABG 03/2010 [facility] admission 05/2010 with PNA/CHF exacerbation Osteoarthritis, knee s/p injection by ortho 03/2010  Past Surgical History: Reviewed history from 04/25/2010 and no changes required. Cholecystectomy Hysterectomy- total with BSO  due to DUB (1973) 03/2010 CABG x3 (left internal mammary artery to left anterior descending, saphenous vein graft to obtuse marginal-1 , saphenous vein graft to distal right coronary); endoscopic vein harvest, right leg.  Social History: Reviewed history from 12/10/2009 and no changes required. 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 Remaining systems are negative.   Vital Signs:  Patient profile:   77 year old  female Height:      60.5 inches Weight:      135 pounds BMI:     26.03 Pulse rate:   70 / minute BP sitting:   102 / 62  (left arm) Cuff size:   regular  Vitals Entered By: Lysbeth Galas CMA (June 14, 2010 3:09 PM)  Physical Exam  General:  Well-developed well-nourished in no acute distress.  Skin is warm and dry.  HEENT is normal.  Neck is supple. No thyromegaly.  Chest is clear to auscultation with normal expansion.  Cardiovascular exam is regular rate and rhythm.  Abdominal exam nontender or distended. No masses palpated. Extremities show no edema. neuro grossly intact    EKG  Procedure date:  06/14/2010  Findings:      Sinus rhythm at a rate of 70. Low voltage. Inferoposterior infarct. Inferior lateral T-wave inversion.  Impression & Recommendations:  Problem # 1:  ISCHEMIC CARDIOMYOPATHY (ICD-414.8)  Continue aspirin, beta blocker, ACE inhibitor and statin. Her updated medication list for this problem includes:    Furosemide 40 Mg Tabs (Furosemide) .Betty Dunn... 1 tablet once daily    Aspirin 325 Mg Tabs (Aspirin) .Betty Dunn... Take 1 tablet by mouth once a day    Carvedilol 6.25 Mg Tabs (Carvedilol) .Betty Dunn... Take 1 tablet by mouth two times a day    Lisinopril 10 Mg Tabs (Lisinopril) .Betty Dunn... Take 1 tablet by mouth once a day  Her updated medication list for this problem includes:    Furosemide 40 Mg Tabs (Furosemide) .Betty Dunn... 1 tablet once daily    Aspirin 325 Mg Tabs (Aspirin) .Betty Dunn... Take 1 tablet by mouth once a day    Carvedilol 6.25 Mg Tabs (Carvedilol) .Betty Dunn... Take 1 tablet by mouth two times a day    Lisinopril 10 Mg Tabs (Lisinopril) .Betty Dunn... Take 1 tablet by mouth once a day  Problem # 2:  CORONARY ARTERY DISEASE (ICD-414.00)  As per #1. Her updated medication list for this problem includes:    Aspirin 325 Mg Tabs (Aspirin) .Betty Dunn... Take 1 tablet by mouth once a day    Carvedilol 6.25 Mg Tabs (Carvedilol) .Betty Dunn... Take 1 tablet by mouth two times a day    Lisinopril 10 Mg Tabs  (Lisinopril) .Betty Dunn... Take 1 tablet by mouth once a day  Her updated medication list for this problem includes:    Aspirin 325 Mg Tabs (Aspirin) .Betty Dunn... Take 1 tablet by mouth once a day    Carvedilol 6.25 Mg Tabs (Carvedilol) .Betty Dunn... Take 1 tablet by mouth two times a day    Lisinopril 10 Mg Tabs (Lisinopril) .Betty Dunn... Take 1 tablet by mouth once a day  Problem # 3:  Hx of HYPERCHOLESTEROLEMIA (ICD-272.0)  Continue statin. Her updated medication list for this problem includes:    Simvastatin 40 Mg Tabs (Simvastatin) .Betty Dunn... 1 tablet at bedtime  Her updated medication list for this problem includes:    Simvastatin 40 Mg Tabs (Simvastatin) .Betty Dunn... 1 tablet at bedtime  Problem # 4:  HYPERTENSION (ICD-401.9)  Blood pressure controlled on present medications. Will continue. Her updated medication list for this problem includes:    Furosemide 40 Mg Tabs (Furosemide) .Betty Dunn... 1 tablet once daily    Aspirin 325 Mg Tabs (Aspirin) .Betty Dunn... Take 1 tablet by mouth once a day    Carvedilol 6.25 Mg Tabs (Carvedilol) .Betty Dunn... Take 1 tablet by mouth  two times a day    Lisinopril 10 Mg Tabs (Lisinopril) .Betty Dunn... Take 1 tablet by mouth once a day  Her updated medication list for this problem includes:    Furosemide 40 Mg Tabs (Furosemide) .Betty Dunn... 1 tablet once daily    Aspirin 325 Mg Tabs (Aspirin) .Betty Dunn... Take 1 tablet by mouth once a day    Carvedilol 6.25 Mg Tabs (Carvedilol) .Betty Dunn... Take 1 tablet by mouth two times a day    Lisinopril 10 Mg Tabs (Lisinopril) .Betty Dunn... Take 1 tablet by mouth once a day  Problem # 5:  Hx of HYPOTHYROIDISM, BORDERLINE (ICD-244.9)  Her updated medication list for this problem includes:    Levothroid 25 Mcg Tabs (Levothyroxine sodium) .Betty Dunn... Take 1 tablet by mouth every morning  Her updated medication list for this problem includes:    Levothroid 25 Mcg Tabs (Levothyroxine sodium) .Betty Dunn... Take 1 tablet by mouth every morning  Patient Instructions: 1)  Your physician recommends that you schedule a  follow-up appointment in: 6 months 2)  Your physician recommends that you continue on your current medications as directed. Please refer to the Current Medication list given to you today.

## 2010-06-18 NOTE — Assessment & Plan Note (Signed)
Summary: ROA FOR 6 MTH F/U/JRR   Vital Signs:  Patient profile:   77 year old female Height:      60.5 inches Weight:      134 pounds BMI:     25.83 Temp:     97.6 degrees F oral Pulse rate:   80 / minute Pulse rhythm:   regular BP sitting:   108 / 62  (left arm) Cuff size:   regular  Vitals Entered By: Delilah Shan CMA (AAMA) (June 10, 2010 10:10 AM) CC: 6 months follow up.  Patient concerned about low BP.   History of Present Illness: s/p CABG and PNA, now living with son.  No fevers.  Breathing is better.  Able to take deep breath.  No cough.  No orthostasis, no CP except with cough prev.  Has home health coming out.  Daily weight has been  ~134 w/o sig variation.    She has a MOST form.  "I don't want to be a vegetable, it would be hard on my family."  We had a long discussion about this today.  The form was filled out.    Allergies: 1)  ! * Septra Ds 2)  ! Sulfa 3)  ! Amlodipine Besylate 4)  Penicillin 5)  Cipro  Physical Exam  General:  GEN: nad, alert and oriented HEENT: mucous membranes moist NECK: supple w/o LA CV: rrr. midline scar noted PULM: ctab, no inc wob ABD: soft, +bs EXT: no edema SKIN: no rash   Impression & Recommendations:  Problem # 1:  CORONARY ARTERY DISEASE (ICD-414.00) MOST form done- she present a blank copy and wanted to talk to me abou this.  She wanted DNR, limited interventions- to avoid ICU, to determine the use/dutation of abx on a case by case basis, IVF per trial period as needed.  She was unsure about tempory tube feeds and wanted to consider this.  original sent with patient.  Nonfasting labs collected.  No w/o CP and feeling well, esp considering recent illnesses.  has follow up with cards and CVTS.  App help of all involved.   >25 min spent with patient, at least half of which was spent on counseling ZO:XWRU.  Her updated medication list for this problem includes:    Furosemide 40 Mg Tabs (Furosemide) .Marland Kitchen... 1 tablet once  daily    Aspirin 325 Mg Tabs (Aspirin) .Marland Kitchen... Take 1 tablet by mouth once a day    Carvedilol 6.25 Mg Tabs (Carvedilol) .Marland Kitchen... Take 1 tablet by mouth two times a day    Lisinopril 10 Mg Tabs (Lisinopril) .Marland Kitchen... Take 1 tablet by mouth once a day  Orders: Venipuncture (04540) Specimen Handling (98119) TLB-BMP (Basic Metabolic Panel-BMET) (80048-METABOL) TLB-Hepatic/Liver Function Pnl (80076-HEPATIC) TLB-Lipid Panel (80061-LIPID)  Complete Medication List: 1)  Levothroid 25 Mcg Tabs (Levothyroxine sodium) .... Take 1 tablet by mouth every morning 2)  Furosemide 40 Mg Tabs (Furosemide) .Marland Kitchen.. 1 tablet once daily 3)  Simvastatin 40 Mg Tabs (Simvastatin) .Marland Kitchen.. 1 tablet at bedtime 4)  Aspirin 325 Mg Tabs (Aspirin) .... Take 1 tablet by mouth once a day 5)  Potassium Chloride 20 Meq/64ml (10%) Soln (Potassium chloride) .... Take 15 ml (3 teaspoonfuls) by mouth daily as directed. 6)  Robitussin Chest Congestion 100 Mg/59ml Syrp (Guaifenesin) .... Take 1 teaspoon every 4-6 hours as needed 7)  Stool Softener Plus Laxative 8.6-50 Mg Tabs (Sennosides-docusate sodium) .... Take 1 tablet by mouth once a day 8)  Ferrous Sulfate 325 (65 Fe) Mg Tabs (  Ferrous sulfate) .... Take 1 tablet by mouth once a day 9)  Carvedilol 6.25 Mg Tabs (Carvedilol) .... Take 1 tablet by mouth two times a day 10)  Lisinopril 10 Mg Tabs (Lisinopril) .... Take 1 tablet by mouth once a day  Patient Instructions: 1)  Don't change your meds now.   2)  If your weight is going up or down by more than a few pounds, let me know.   3)  If you get dizzy on standing, let us know so we can work on your blood pressure meds.   4)  I want to see you back in 1 month to check up on your pressure.  5)  Keep the MOST form available in case you need it.   6)  Take care.  Glad to see you today.  Prescriptions: LISINOPRIL 10 MG TABS (LISINOPRIL) Take 1 tablet by mouth once a day  #30 x 12   Entered by:   Delilah Shan CMA (AAMA)   Authorized by:    Crawford Givens MD   Signed by:   Delilah Shan CMA (AAMA) on 06/10/2010   Method used:   Electronically to        [facility] [phone]* (retail)       [address] [place], [place]  [address]       Ph: [phone]       Fax: [phone]   RxID:   [id number] CARVEDILOL 6.25 MG TABS (CARVEDILOL) Take 1 tablet by mouth two times a day  #30 x 12   Entered by:   Delilah Shan CMA (AAMA)   Authorized by:   Crawford Givens MD   Signed by:   Delilah Shan CMA (AAMA) on 06/10/2010   Method used:   Electronically to        [facility] [phone]* (retail)       [address] [place], [place]  [address]       Ph: [phone]       Fax: [phone]   RxID:   [id number]    Orders Added: 1)  Venipuncture [10932] 2)  Specimen Handling [99000] 3)  Est. Patient Level IV [35573] 4)  TLB-BMP (Basic Metabolic Panel-BMET) [80048-METABOL] 5)  TLB-Hepatic/Liver Function Pnl [80076-HEPATIC] 6)  TLB-Lipid Panel [80061-LIPID]  Appended Document: ROA FOR 6 MTH F/U/JRR Quantity changed to 60, called to medicap.  Appended Document: ROA FOR 6 MTH F/U/JRR noted, thanks.

## 2010-06-24 ENCOUNTER — Encounter: Payer: Self-pay | Admitting: Family Medicine

## 2010-07-08 NOTE — Assessment & Plan Note (Signed)
OFFICE VISIT  AILEENE, LANUM DOB:  [birth year]                                        June 17, 2010 CHART #:  [id number]  Ms. Mendonca is an 77 year old lady who had coronary bypass grafting x3 on January 25.  She was seen in the office on February 29, between discharge from the hospital in that visit, she had been admitted over to [facility] with pneumonia and was treated with antibiotics.  When I saw her on 29, she had a mild-to-moderate left pleural effusion, but otherwise is doing quite well.  I gave her a prednisone taper and she now returns for a 3-week followup visit to make sure that effusion is not worsened.  She states that she had been doing fairly well.  She still has some mobility issues and she is here in a wheelchair.  She has not had any difficulty with shortness of breath, orthopnea or swelling in her legs.  She is not having any incisional pain.  CURRENT MEDICATIONS: 1. Ensure b.i.d. 2. Ferrous gluconate 324 mg b.i.d. 3. Lopressor 12.5 mg b.i.d. 4. Midodrine 10 mg q.a.m. 5. Zocor 40 mg daily. 6. Aspirin 325 mg daily. 7. Levothyroxine 50 mcg daily. 8. Lasix 40 mg daily. 9. Potassium liquid 30 mEq daily. 10.Ultram and oxycodone p.r.n., which she is not having to take.  She     has finished the prednisone.  PHYSICAL EXAMINATION:  VITAL SIGNS:  Blood pressure is 96/55, pulse 70, respirations 16, oxygen saturation 98% on room air.  LUNGS:  Equal breath sounds bilaterally.  CARDIAC:  Has regular rate and rhythm. Normal S1-S2.  No rubs, murmurs, or gallops.  Sternal incision is clean, dry, intact.  Sternum is stable.  She has no peripheral edema.  Chest x-ray shows marked improvement of her left pleural effusion. There is a tiny residual blunting of the left costophrenic angle.  IMPRESSION:  Ms. Heiner is an 77 year old woman.  She is now about 2 months out from coronary bypass grafting.  She had a left pleural effusion which is now  nearly completely resolved with a prednisone taper.  She does not need any further treatment and no followup for that.  I did advise her the symptoms of that returning including cough and orthopnea.  She wants to come off the Lasix.  I think at this point it would be reasonable to try her off the Lasix and I advised them also to stop the potassium.  I did caution them she were to gain more than 3 pounds in a day or more than 5 pounds in 3 days that is a sign of fluid accumulation, and she should contact either Dr. Para March or Dr. Gala Romney to discuss whether she needs to restart the Lasix at that time.  I would be happy to see Mrs. Sonneborn back in anytime and I could be of any further assistance with her care.  Salvatore Decent Dorris Fetch, M.D. Electronically Signed  SCH/MEDQ  D:  06/17/2010  T:  06/18/2010  Job:  [id number]  cc:   Bevelyn Buckles. Bensimhon, MD Dwana Curd. Para March, M.D.

## 2010-07-09 ENCOUNTER — Other Ambulatory Visit: Payer: Self-pay | Admitting: Family Medicine

## 2010-07-09 ENCOUNTER — Encounter: Payer: Self-pay | Admitting: Family Medicine

## 2010-07-10 ENCOUNTER — Encounter: Payer: Self-pay | Admitting: Family Medicine

## 2010-07-15 ENCOUNTER — Encounter: Payer: Self-pay | Admitting: Family Medicine

## 2010-07-15 ENCOUNTER — Ambulatory Visit (INDEPENDENT_AMBULATORY_CARE_PROVIDER_SITE_OTHER): Payer: Medicare PPO | Admitting: Family Medicine

## 2010-07-15 ENCOUNTER — Ambulatory Visit: Payer: Medicare PPO | Admitting: Family Medicine

## 2010-07-15 DIAGNOSIS — I10 Essential (primary) hypertension: Secondary | ICD-10-CM

## 2010-07-15 MED ORDER — POTASSIUM CHLORIDE 20 MEQ/15ML (10%) PO SOLN: ORAL | Status: DC

## 2010-07-15 NOTE — Assessment & Plan Note (Addendum)
Controlled.  No change in meds for now, but she can hold the lasix/K if no edema.  See instructions.

## 2010-07-15 NOTE — Progress Notes (Signed)
Hypertension:    Using medication without problems or lightheadedness: yes Chest pain with exertion:no Edema:occ L ankle edema in the afternoon, better in AM- this is longstanding per patient Short of breath:no Average home BPs: not checked.  Other issues: Off lasix most of the days during last few weeks.  She took the lasix when she had more edema in her ankles and it helped.   Has a lifeline at home.    Meds, vitals, and allergies reviewed.   Her sister has been helping with cooking.  Weight is stable.    ROS: See HPI.  Otherwise negative.    GEN: nad, alert and oriented HEENT: mucous membranes moist NECK: supple w/o LA CV: rrr. Midline sternotomy scar healed well PULM: ctab, no inc wob ABD: soft, +bs EXT: no edema SKIN: no acute rash

## 2010-07-15 NOTE — Patient Instructions (Signed)
Take the fluid pill (FUROSEMIDE) with the potassium when you have more swelling your ankles.  You don't have to take them everyday. Keep taking your other meds like you have been. If you get a lot more swelling or your weight is increasing quickly, let me know.   Take care.   I'd like to see you back in 3-4 months to check your pressure.

## 2010-07-23 ENCOUNTER — Telehealth: Payer: Self-pay | Admitting: *Deleted

## 2010-07-23 ENCOUNTER — Ambulatory Visit (INDEPENDENT_AMBULATORY_CARE_PROVIDER_SITE_OTHER): Payer: Medicare PPO | Admitting: Family Medicine

## 2010-07-23 ENCOUNTER — Encounter (INDEPENDENT_AMBULATORY_CARE_PROVIDER_SITE_OTHER): Payer: Medicare PPO | Admitting: *Deleted

## 2010-07-23 ENCOUNTER — Telehealth: Payer: Self-pay | Admitting: Family Medicine

## 2010-07-23 ENCOUNTER — Other Ambulatory Visit: Payer: Self-pay | Admitting: Cardiology

## 2010-07-23 ENCOUNTER — Encounter: Payer: Self-pay | Admitting: Family Medicine

## 2010-07-23 VITALS — BP 120/72 | HR 80 | Temp 97.8°F | Wt 134.1 lb

## 2010-07-23 DIAGNOSIS — M79609 Pain in unspecified limb: Secondary | ICD-10-CM

## 2010-07-23 DIAGNOSIS — M79606 Pain in leg, unspecified: Secondary | ICD-10-CM

## 2010-07-23 DIAGNOSIS — R229 Localized swelling, mass and lump, unspecified: Secondary | ICD-10-CM

## 2010-07-23 NOTE — Assessment & Plan Note (Addendum)
Likely lipoma vs superficial clot but will check for DVT given her history of surgery on that leg.  I don't suspect abscess as it is minimally ttp and not red.   Addendum- u/s with likely lipoma.  No intervention now and fu pr.  See result note.   >25 min spent with face to face with patient counseling and coordinating care

## 2010-07-23 NOTE — Progress Notes (Signed)
Knot noted in R posterior knee, h/o vein harvest on that leg for CABG.  Was a little tender yesterday. A little more tender today.  No FCNAV.  "I feel all right otherwise."  Nothing makes the discomfort better or worse.  Breathing is "all right."    Meds, vitals, and allergies reviewed.   ROS: See HPI.  Otherwise, noncontributory.  nad rrr ctab Midline scar healing well Ext w/o edema but 6x6cm mass noted on R posterior knee.  Minimally ttp, no significant warmth. Not red.

## 2010-07-23 NOTE — Telephone Encounter (Signed)
Patient adivsed

## 2010-07-23 NOTE — Telephone Encounter (Signed)
LMOVM to return call.

## 2010-07-23 NOTE — Patient Instructions (Signed)
See [place] about your referral before your leave today.  

## 2010-07-23 NOTE — Telephone Encounter (Signed)
Message copied by Raechel Ache on Tue Jul 23, 2010  1:41 PM ------      Message from: Lula Olszewski      Created: Tue Jul 23, 2010 11:20 AM      Regarding: Prelim venous report       Venous duplex negative for DVT.      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

## 2010-07-23 NOTE — Telephone Encounter (Signed)
Opened in error

## 2010-07-23 NOTE — Telephone Encounter (Signed)
Please call pt.  I tried to call but couldn't get her on the phone.  She appears to have a lipoma.  This isn't a blood clot and nothing needs to be done unless it becomes a lot more painful or gets a lot larger.  It is a benign collection of fatty tissue.

## 2010-07-24 ENCOUNTER — Encounter: Payer: Self-pay | Admitting: Family Medicine

## 2010-07-25 ENCOUNTER — Encounter: Payer: Self-pay | Admitting: Cardiology

## 2010-08-05 ENCOUNTER — Other Ambulatory Visit: Payer: Self-pay | Admitting: *Deleted

## 2010-08-05 ENCOUNTER — Other Ambulatory Visit: Payer: Self-pay | Admitting: Family Medicine

## 2010-08-05 MED ORDER — CARVEDILOL 6.25 MG PO TABS: 6.2500 mg | ORAL_TABLET | Freq: Two times a day (BID) | ORAL | Status: DC

## 2010-08-05 MED ORDER — FUROSEMIDE 40 MG PO TABS: 40.0000 mg | ORAL_TABLET | ORAL | Status: DC | PRN

## 2010-08-05 MED ORDER — LISINOPRIL 10 MG PO TABS: 10.0000 mg | ORAL_TABLET | Freq: Every day | ORAL | Status: DC

## 2010-08-05 MED ORDER — SIMVASTATIN 40 MG PO TABS: 40.0000 mg | ORAL_TABLET | Freq: Every day | ORAL | Status: DC

## 2010-08-05 MED ORDER — LEVOTHYROXINE SODIUM 25 MCG PO TABS: 25.0000 ug | ORAL_TABLET | Freq: Every day | ORAL | Status: DC

## 2010-08-05 MED ORDER — POTASSIUM CHLORIDE 20 MEQ/15ML (10%) PO SOLN: ORAL | Status: DC

## 2010-09-05 ENCOUNTER — Emergency Department: Payer: Self-pay | Admitting: Emergency Medicine

## 2010-10-14 ENCOUNTER — Encounter: Payer: Self-pay | Admitting: Family Medicine

## 2010-10-14 ENCOUNTER — Ambulatory Visit (INDEPENDENT_AMBULATORY_CARE_PROVIDER_SITE_OTHER): Payer: Medicare PPO | Admitting: Family Medicine

## 2010-10-14 DIAGNOSIS — E039 Hypothyroidism, unspecified: Secondary | ICD-10-CM

## 2010-10-14 DIAGNOSIS — K5900 Constipation, unspecified: Secondary | ICD-10-CM | POA: Insufficient documentation

## 2010-10-14 DIAGNOSIS — Z7189 Other specified counseling: Secondary | ICD-10-CM | POA: Insufficient documentation

## 2010-10-14 DIAGNOSIS — I10 Essential (primary) hypertension: Secondary | ICD-10-CM

## 2010-10-14 DIAGNOSIS — I2510 Atherosclerotic heart disease of native coronary artery without angina pectoris: Secondary | ICD-10-CM

## 2010-10-14 MED ORDER — SENNOSIDES 8.6 MG PO TABS: ORAL_TABLET | ORAL | Status: DC

## 2010-10-14 NOTE — Assessment & Plan Note (Signed)
No CP and feeling well w/o SOB/edema.

## 2010-10-14 NOTE — Assessment & Plan Note (Signed)
Use senokot-s prn and f/u prn.  She understands.  Continue colace.

## 2010-10-14 NOTE — Assessment & Plan Note (Signed)
Controlled , no change in meds 

## 2010-10-14 NOTE — Progress Notes (Signed)
"  I feel all right except for my bowels."  Occ with some stools that are smaller, occ "with a decent BM".  "My bowels wouldn't move w/o the softener."   Small amount of occ red blood, no black stools.  Prev MD had checked stools for blood for IFOB screening.    Hypertension:    Using medication without problems or lightheadedness: yes Chest pain with exertion:no Edema:occ L ankle edema, rare use of lasix recently Short of breath:no  CAD: no CP, breathing is good.  Rare cough.  Rare clear sputum, "I think it's allergies."   Meds, vitals, and allergies reviewed.   PMH and SH reviewed  ROS: See HPI.  Otherwise negative.    GEN: nad, alert and oriented HEENT: mucous membranes moist NECK: supple w/o LA CV: rrr. Midline sternotomy noted, healed PULM: ctab, no inc wob ABD: soft, +bs EXT: no edema, well perfused SKIN: no acute rash

## 2010-10-14 NOTE — Patient Instructions (Signed)
I would get a flu shot each fall.   Let me know if you have questions about the health care power of attorney sheet.   Take the stool softener each day and take senna-s on Sunday and Wednesday.  Call me if you continue to have trouble. I want to recheck your labs in 3-4 months, ahead of a OV.  Take care.

## 2011-01-07 ENCOUNTER — Other Ambulatory Visit (INDEPENDENT_AMBULATORY_CARE_PROVIDER_SITE_OTHER): Payer: Medicare PPO

## 2011-01-07 DIAGNOSIS — I2510 Atherosclerotic heart disease of native coronary artery without angina pectoris: Secondary | ICD-10-CM

## 2011-01-07 DIAGNOSIS — E039 Hypothyroidism, unspecified: Secondary | ICD-10-CM

## 2011-01-07 LAB — COMPREHENSIVE METABOLIC PANEL
ALT: 12 U/L (ref 0–35)
CO2: 28 mEq/L (ref 19–32)
Calcium: 9 mg/dL (ref 8.4–10.5)
Chloride: 105 mEq/L (ref 96–112)
Creatinine, Ser: 1.1 mg/dL (ref 0.4–1.2)
GFR: 50.61 mL/min — ABNORMAL LOW (ref >60.00)
Glucose, Bld: 91 mg/dL (ref 70–99)
Sodium: 140 mEq/L (ref 135–145)
Total Bilirubin: 0.9 mg/dL (ref 0.3–1.2)
Total Protein: 6.4 g/dL (ref 6.0–8.3)

## 2011-01-07 LAB — TSH: TSH: 2.56 u[IU]/mL (ref 0.35–5.50)

## 2011-01-07 LAB — LIPID PANEL
Cholesterol: 148 mg/dL (ref 0–200)
VLDL: 21.8 mg/dL (ref 0.0–40.0)

## 2011-01-14 ENCOUNTER — Ambulatory Visit (INDEPENDENT_AMBULATORY_CARE_PROVIDER_SITE_OTHER): Payer: Medicare PPO | Admitting: Family Medicine

## 2011-01-14 ENCOUNTER — Encounter: Payer: Self-pay | Admitting: Family Medicine

## 2011-01-14 VITALS — BP 122/60 | HR 80 | Temp 97.9°F | Wt 136.1 lb

## 2011-01-14 DIAGNOSIS — I2589 Other forms of chronic ischemic heart disease: Secondary | ICD-10-CM

## 2011-01-14 DIAGNOSIS — R21 Rash and other nonspecific skin eruption: Secondary | ICD-10-CM

## 2011-01-14 DIAGNOSIS — Z23 Encounter for immunization: Secondary | ICD-10-CM

## 2011-01-14 DIAGNOSIS — E039 Hypothyroidism, unspecified: Secondary | ICD-10-CM

## 2011-01-14 DIAGNOSIS — I10 Essential (primary) hypertension: Secondary | ICD-10-CM

## 2011-01-14 DIAGNOSIS — E7800 Pure hypercholesterolemia, unspecified: Secondary | ICD-10-CM

## 2011-01-14 NOTE — Progress Notes (Signed)
CAD- no typical chest pain.  Occ with sensation in chest (but not pain) that last 1-2 seconds when she starts with movement.  It self resolves and then she may not have any other sx for a prolonged period.  Feeling well o/w. Not sob.   Still with runny nose that she attributes to allergies.   Hypothyroid.  Compliant with meds.  No ant neck pain.  No neck mass.  Labs d/w pt.    Elevated Cholesterol: Using medications without problems:yes Muscle aches: no Diet compliance: "I'm trying." Exercise: some walking at her residence.    Hypertension:    Using medication without problems or lightheadedness: yes Chest pain with exertion:no, see above Edema:occ L foot edema Short of breath:no Average home BPs: not checked.   Other issues: some irritation on L medial lower leg, present for about 1 month.  It may be episodically tender and warm. Not at a harvest site. Very rare use of lasix.    Meds, vitals, and allergies reviewed.   PMH and SH reviewed  ROS: See HPI.  Otherwise negative.    GEN: nad, alert and oriented HEENT: mucous membranes moist, clear nasal discharge NECK: supple w/o LA CV: rrr. Midline scar noted PULM: ctab, no inc wob ABD: soft, +bs EXT: no edema SKIN: no acute rash but mild irritation ~4cm in diameter on L medial lower leg, no skin breakdown and no purulent discharge

## 2011-01-14 NOTE — Patient Instructions (Signed)
See Shirlee Limerick about your cardiology appointment in January before your leave today. You can take plain claritin (not claritin D) for allergies.  Don't change your meds for now and let me know if you have other concerns in the meantime.  I'd like to see you back in March 2013 to check up on your blood pressure.   Take care.

## 2011-01-15 DIAGNOSIS — R21 Rash and other nonspecific skin eruption: Secondary | ICD-10-CM | POA: Insufficient documentation

## 2011-01-15 NOTE — Assessment & Plan Note (Signed)
Euvolemic w/o SOB.  Feeling well.  It doesn't appear that the chest sx are significant. Will follow.

## 2011-01-15 NOTE — Assessment & Plan Note (Signed)
BP controlled, no change in meds.  

## 2011-01-15 NOTE — Assessment & Plan Note (Signed)
Labs at goal, no change in meds.

## 2011-01-15 NOTE — Assessment & Plan Note (Signed)
Continue current meds.  TSH wnl.

## 2011-01-15 NOTE — Assessment & Plan Note (Signed)
Unclear source, this doesn't appear infectious and may be due to dry skin. She'll put vaseline on it and f/u prn.

## 2011-02-08 ENCOUNTER — Emergency Department: Payer: Self-pay | Admitting: *Deleted

## 2011-02-10 ENCOUNTER — Encounter: Payer: Self-pay | Admitting: Cardiovascular Disease

## 2011-02-11 ENCOUNTER — Ambulatory Visit (INDEPENDENT_AMBULATORY_CARE_PROVIDER_SITE_OTHER): Payer: Medicare PPO | Admitting: Cardiovascular Disease

## 2011-02-11 ENCOUNTER — Encounter: Payer: Self-pay | Admitting: Cardiovascular Disease

## 2011-02-11 VITALS — BP 132/70 | HR 60 | Ht 63.0 in | Wt 140.8 lb

## 2011-02-11 DIAGNOSIS — I2510 Atherosclerotic heart disease of native coronary artery without angina pectoris: Secondary | ICD-10-CM

## 2011-02-11 DIAGNOSIS — I2589 Other forms of chronic ischemic heart disease: Secondary | ICD-10-CM

## 2011-02-11 DIAGNOSIS — I25810 Atherosclerosis of coronary artery bypass graft(s) without angina pectoris: Secondary | ICD-10-CM

## 2011-02-11 DIAGNOSIS — E7800 Pure hypercholesterolemia, unspecified: Secondary | ICD-10-CM

## 2011-02-11 DIAGNOSIS — I10 Essential (primary) hypertension: Secondary | ICD-10-CM

## 2011-02-11 NOTE — Assessment & Plan Note (Signed)
Recent posterior shoulder pain is somewhat atypical in nature. She has been active with no recurrent symptoms. We have suggested we monitor her closely at this time. I have asked her to call the clinic if she has any recurrent pain. She does not want nitroglycerin sublingual.

## 2011-02-11 NOTE — Assessment & Plan Note (Signed)
Cholesterol is at goal on the current lipid regimen. No changes to the medications were made.  

## 2011-02-11 NOTE — Assessment & Plan Note (Signed)
Blood pressure is well controlled on today's visit. No changes made to the medications. 

## 2011-02-11 NOTE — Progress Notes (Signed)
Patient ID: Betty Dunn, female    DOB: 11/07/1922, 77 y.o.   MRN: [id number]  HPI Comments: 77 year old female who was admitted to [facility] in January of 2012 and ruled in for myocardial infarction. Cardiac catheterization 04/23/2010  revealed left main and three-vessel disease with an ejection fraction of 35%. She underwent coronary artery bypass grafting 04/24/2010 with a left internal mammary artery to left anterior descending, saphenous vein graft to obtuse marginal-1 , saphenous vein graft to distal right coronary,  readmitted to [facility] with CHF and pneumonia following the procedure. Echocardiogram was performed and showed an ejection fraction of 45% with inferior posterior hypokinesis; mild to moderate MR; mild TR. Seen by Dr. Jens Som in March with followup with Dr. Para March.  Overall she reports that she has been feeling well. She did wake this past weekend on Saturday at 4 AM with bilateral posterior shoulder pain. Symptoms lasted until 10 AM. She went to the emergency room the EKG was essentially negative and she had negative cardiac enzymes x2. She was discharged home, has been active since then with no significant shoulder pain or chest pain. No shortness of breath. Otherwise she feels well.   She denies any significant lower extremity edema, no lightheadedness or dizziness. She is tolerating her medications well In the hospital, hematocrit 35, creatinine 1.16, chest x-ray showed no significant changes  EKG shows normal sinus rhythm with rate 60 beats per minute with T-wave abnormality in 3, aVF  EKG from February 2012 shows ejection fraction 40-45%, moderate to severe inferior wall hypokinesis, posterior wall creases, apical wall hypokinesis, mild to moderate MR, right ventricular systolic pressure 30-40   Outpatient Encounter Prescriptions as of 02/11/2011  Medication Sig Dispense Refill  . aspirin 81 MG tablet Take 81 mg by mouth daily.        . carvedilol (COREG) 6.25  MG tablet Take 1 tablet (6.25 mg total) by mouth 2 (two) times daily with a meal.  180 tablet  3  . docusate sodium (COLACE) 100 MG capsule Take 100 mg by mouth at bedtime.        . ferrous sulfate 325 (65 FE) MG tablet Take 325 mg by mouth daily with breakfast.        . furosemide (LASIX) 40 MG tablet Take 1 tablet (40 mg total) by mouth as needed. For ankle swelling (take potassium when you take the furosemide)  90 tablet  1  . levothyroxine (SYNTHROID, LEVOTHROID) 25 MCG tablet Take 1 tablet (25 mcg total) by mouth daily.  90 tablet  3  . lisinopril (PRINIVIL,ZESTRIL) 10 MG tablet Take 1 tablet (10 mg total) by mouth daily.  90 tablet  3  . potassium chloride 20 MEQ/15ML (10%) SOLN Take 3 teaspoons with furosemide  450 mL  3  . senna (SENOKOT) 8.6 MG tablet 1 by mouth on Sunday and Wednesday  60 tablet  1  . simvastatin (ZOCOR) 40 MG tablet Take 1 tablet (40 mg total) by mouth at bedtime.  90 tablet  3  . DISCONTD: aspirin 325 MG tablet Take 325 mg by mouth daily.          Review of Systems  Constitutional: Negative.   HENT: Negative.   Eyes: Negative.   Respiratory: Negative.   Cardiovascular: Negative.   Gastrointestinal: Negative.   Musculoskeletal: Positive for back pain.  Skin: Negative.   Neurological: Negative.   Hematological: Negative.   Psychiatric/Behavioral: Negative.   All other systems reviewed and are negative.  BP 132/70  Pulse 60  Ht 5\' 3"  (1.6 m)  Wt 140 lb 12 oz (63.844 kg)  BMI 24.93 kg/m2  Physical Exam  Nursing note and vitals reviewed. Constitutional: She is oriented to person, place, and time. She appears well-developed and well-nourished.  HENT:  Head: Normocephalic.  Nose: Nose normal.  Mouth/Throat: Oropharynx is clear and moist.  Eyes: Conjunctivae are normal. Pupils are equal, round, and reactive to light.  Neck: Normal range of motion. Neck supple. No JVD present.  Cardiovascular: Normal rate, regular rhythm, S1 normal, S2 normal, normal  heart sounds and intact distal pulses.  Exam reveals no gallop and no friction rub.   No murmur heard. Pulmonary/Chest: Effort normal and breath sounds normal. No respiratory distress. She has no wheezes. She has no rales. She exhibits no tenderness.  Abdominal: Soft. Bowel sounds are normal. She exhibits no distension. There is no tenderness.  Musculoskeletal: Normal range of motion. She exhibits no edema and no tenderness.  Lymphadenopathy:    She has no cervical adenopathy.  Neurological: She is alert and oriented to person, place, and time. Coordination normal.  Skin: Skin is warm and dry. No rash noted. No erythema.  Psychiatric: She has a normal mood and affect. Her behavior is normal. Judgment and thought content normal.         Assessment and Plan

## 2011-02-11 NOTE — Assessment & Plan Note (Signed)
Previous ejection fraction earlier this year estimated at 40-45%. No evidence of fluid overload. She takes diuretic p.r.n. For edema or shortness of breath with potassium p.r.n..

## 2011-02-11 NOTE — Patient Instructions (Signed)
You are doing well. No medication changes were made.  Please call the office if you start having more should/back pain  Please call us if you have new issues that need to be addressed before your next appt.  The office will contact you for a follow up Appt. In 6 months

## 2011-04-14 ENCOUNTER — Emergency Department: Payer: Self-pay | Admitting: *Deleted

## 2011-04-14 LAB — URINALYSIS, COMPLETE
Bilirubin,UR: NEGATIVE
Nitrite: NEGATIVE
Ph: 5 (ref 4.5–8.0)
Protein: NEGATIVE
RBC,UR: <1 /HPF (ref 0–5)
Squamous Epithelial: <1
WBC UR: 7 /HPF (ref 0–5)

## 2011-04-14 LAB — COMPREHENSIVE METABOLIC PANEL
Albumin: 3.3 g/dL — ABNORMAL LOW (ref 3.4–5.0)
Alkaline Phosphatase: 67 U/L (ref 50–136)
BUN: 20 mg/dL — ABNORMAL HIGH (ref 7–18)
Chloride: 105 mmol/L (ref 98–107)
EGFR (Non-African Amer.): >60
Glucose: 116 mg/dL — ABNORMAL HIGH (ref 65–99)
Osmolality: 289 (ref 275–301)
Potassium: 3.9 mmol/L (ref 3.5–5.1)
SGOT(AST): 22 U/L (ref 15–37)
SGPT (ALT): 15 U/L
Total Protein: 6.2 g/dL — ABNORMAL LOW (ref 6.4–8.2)

## 2011-04-14 LAB — CBC
HGB: 11.9 g/dL — ABNORMAL LOW (ref 12.0–16.0)
MCH: 31.2 pg (ref 26.0–34.0)
Platelet: 176 10*3/uL (ref 150–440)
RDW: 14.2 % (ref 11.5–14.5)
WBC: 7.7 10*3/uL (ref 3.6–11.0)

## 2011-06-09 ENCOUNTER — Ambulatory Visit: Payer: Medicare PPO | Admitting: Family Medicine

## 2011-06-10 ENCOUNTER — Ambulatory Visit: Payer: Medicare PPO | Admitting: Family Medicine

## 2011-06-13 ENCOUNTER — Ambulatory Visit (INDEPENDENT_AMBULATORY_CARE_PROVIDER_SITE_OTHER): Payer: Medicare PPO | Admitting: Family Medicine

## 2011-06-13 ENCOUNTER — Encounter: Payer: Self-pay | Admitting: Family Medicine

## 2011-06-13 VITALS — BP 138/72 | HR 76 | Temp 97.5°F | Wt 137.0 lb

## 2011-06-13 DIAGNOSIS — K5900 Constipation, unspecified: Secondary | ICD-10-CM

## 2011-06-13 DIAGNOSIS — Z66 Do not resuscitate: Secondary | ICD-10-CM

## 2011-06-13 DIAGNOSIS — Z7189 Other specified counseling: Secondary | ICD-10-CM

## 2011-06-13 DIAGNOSIS — I10 Essential (primary) hypertension: Secondary | ICD-10-CM

## 2011-06-13 NOTE — Progress Notes (Signed)
Constipation but not on senokot yet.  No sx of impaction.  Still with some BMs but harder to move.   Hypertension:   Using medication without problems or lightheadedness: yes Chest pain with exertion:no Edema:not except for small amount in L ankle Short of breath:no Isn't having to take lasix/potassium recently.    Meds, vitals, and allergies reviewed.   PMH and SH reviewed  ROS: See HPI.  Otherwise negative.    GEN: nad, alert and oriented HEENT: mucous membranes moist NECK: supple w/o LA CV: rrr. Midline sternotomy scar noted PULM: ctab, no inc wob ABD: soft, +bs EXT: no edema SKIN: no acute rash

## 2011-06-13 NOTE — Patient Instructions (Signed)
Don't change your meds for now (except for starting back on the senokot) and I'll send the others to the mail order pharmacy.   Take care.  Come back and see me in 6 months.  Glad to see you.

## 2011-06-15 ENCOUNTER — Encounter: Payer: Self-pay | Admitting: Family Medicine

## 2011-06-15 DIAGNOSIS — Z66 Do not resuscitate: Secondary | ICD-10-CM | POA: Insufficient documentation

## 2011-06-15 MED ORDER — LEVOTHYROXINE SODIUM 25 MCG PO TABS: 25.0000 ug | ORAL_TABLET | Freq: Every day | ORAL | Status: DC

## 2011-06-15 MED ORDER — CARVEDILOL 6.25 MG PO TABS: 6.2500 mg | ORAL_TABLET | Freq: Two times a day (BID) | ORAL | Status: DC

## 2011-06-15 MED ORDER — LISINOPRIL 10 MG PO TABS: 10.0000 mg | ORAL_TABLET | Freq: Every day | ORAL | Status: DC

## 2011-06-15 MED ORDER — SIMVASTATIN 40 MG PO TABS: 40.0000 mg | ORAL_TABLET | Freq: Every day | ORAL | Status: DC

## 2011-06-15 NOTE — Assessment & Plan Note (Signed)
BP controlled, no change in meds.  

## 2011-06-15 NOTE — Assessment & Plan Note (Signed)
Start back on senokot and call back as needed.

## 2011-06-20 ENCOUNTER — Encounter: Payer: Self-pay | Admitting: Family Medicine

## 2011-06-20 ENCOUNTER — Other Ambulatory Visit: Payer: Self-pay | Admitting: Family Medicine

## 2011-06-20 ENCOUNTER — Other Ambulatory Visit: Payer: Self-pay | Admitting: *Deleted

## 2011-06-20 MED ORDER — LISINOPRIL 10 MG PO TABS: 10.0000 mg | ORAL_TABLET | Freq: Every day | ORAL | Status: DC

## 2011-06-20 MED ORDER — SIMVASTATIN 40 MG PO TABS: 40.0000 mg | ORAL_TABLET | Freq: Every day | ORAL | Status: DC

## 2011-06-20 MED ORDER — CARVEDILOL 6.25 MG PO TABS: 6.2500 mg | ORAL_TABLET | Freq: Two times a day (BID) | ORAL | Status: DC

## 2011-06-20 MED ORDER — LEVOTHYROXINE SODIUM 25 MCG PO TABS: 25.0000 ug | ORAL_TABLET | Freq: Every day | ORAL | Status: DC

## 2011-06-20 NOTE — Telephone Encounter (Signed)
This should have been faxed.  Please find out what she needs to send locally.  Thanks.

## 2011-06-20 NOTE — Telephone Encounter (Signed)
Pt is wondering about her RX's that were going to be sent in from the last office visit. She is about completely out and still hasn't received anything.

## 2011-06-20 NOTE — Telephone Encounter (Signed)
Lugene spot with patient, she isn't out of meds.  rxs resent.

## 2011-06-20 NOTE — Telephone Encounter (Signed)
Rx's were faxed as requested on 06-16-11 to [facility].  Re-faxed today and sent them electronically.

## 2011-08-13 ENCOUNTER — Encounter: Payer: Self-pay | Admitting: Cardiovascular Disease

## 2011-08-13 ENCOUNTER — Ambulatory Visit (INDEPENDENT_AMBULATORY_CARE_PROVIDER_SITE_OTHER): Payer: Medicare PPO | Admitting: Cardiovascular Disease

## 2011-08-13 VITALS — BP 117/65 | HR 67 | Ht 63.0 in | Wt 135.0 lb

## 2011-08-13 DIAGNOSIS — M542 Cervicalgia: Secondary | ICD-10-CM

## 2011-08-13 DIAGNOSIS — I10 Essential (primary) hypertension: Secondary | ICD-10-CM

## 2011-08-13 DIAGNOSIS — E7800 Pure hypercholesterolemia, unspecified: Secondary | ICD-10-CM

## 2011-08-13 DIAGNOSIS — I2589 Other forms of chronic ischemic heart disease: Secondary | ICD-10-CM

## 2011-08-13 NOTE — Assessment & Plan Note (Signed)
Blood pressure is well controlled on today's visit. No changes made to the medications. 

## 2011-08-13 NOTE — Assessment & Plan Note (Signed)
Currently with no symptoms of angina. No further workup at this time. Continue current medication regimen. Appears euvolemic. Minimal edema at the end of the day.

## 2011-08-13 NOTE — Assessment & Plan Note (Signed)
Cholesterol is at goal on the current lipid regimen. No changes to the medications were made.  

## 2011-08-13 NOTE — Progress Notes (Signed)
Patient ID: Betty Dunn, female    DOB: 1922-06-02, 78 y.o.   MRN: [id number]  HPI Comments: 78 year old female who was admitted to [facility] in January of 2012 and ruled in for myocardial infarction. Cardiac catheterization 04/23/2010  revealed left main and three-vessel disease with an ejection fraction of 35%. She underwent coronary artery bypass grafting 04/24/2010 with a left internal mammary artery to left anterior descending, saphenous vein graft to obtuse marginal-1 , saphenous vein graft to distal right coronary,  readmitted to [facility] with CHF and pneumonia following the procedure. Echocardiogram was performed and showed an ejection fraction of 45% with inferior posterior hypokinesis; mild to moderate MR; mild TR. Seen by Dr. Jens Som in March with followup with Dr. Para March.  Overall she reports that she has been feeling well.  She denies any further chest pain, shoulder pain, shortness of breath. She is active. She is helping to take care of her son who has diabetes and gastroparesis. She helps to maintain her house.   She is tolerating her medications well  EKG shows normal sinus rhythm with rate 67 beats per minute with T-wave abnormality in V3 to V6, aVF, III  Echo from February 2012 shows ejection fraction 40-45%, moderate to severe inferior wall hypokinesis, posterior wall creases, apical wall hypokinesis, mild to moderate MR, right ventricular systolic pressure 30-40   Outpatient Encounter Prescriptions as of 08/13/2011  Medication Sig Dispense Refill  . aspirin 81 MG tablet Take 81 mg by mouth daily.        . carvedilol (COREG) 6.25 MG tablet Take 1 tablet (6.25 mg total) by mouth 2 (two) times daily with a meal.  180 tablet  3  . docusate sodium (COLACE) 100 MG capsule Take 100 mg by mouth at bedtime.        . ferrous sulfate 325 (65 FE) MG tablet Take 325 mg by mouth daily with breakfast.        . furosemide (LASIX) 40 MG tablet Take 1 tablet (40 mg total) by mouth  as needed. For ankle swelling (take potassium when you take the furosemide)  90 tablet  1  . levothyroxine (SYNTHROID, LEVOTHROID) 25 MCG tablet Take 1 tablet (25 mcg total) by mouth daily.  90 tablet  3  . lisinopril (PRINIVIL,ZESTRIL) 10 MG tablet Take 1 tablet (10 mg total) by mouth daily.  90 tablet  3  . potassium chloride 20 MEQ/15ML (10%) SOLN Take 3 teaspoons with furosemide  450 mL  3  . senna (SENOKOT) 8.6 MG tablet Take 1 tablet on Sunday and Wednesday      . simvastatin (ZOCOR) 40 MG tablet Take 1 tablet (40 mg total) by mouth at bedtime.  90 tablet  3    Review of Systems  Constitutional: Negative.   HENT: Negative.   Eyes: Negative.   Respiratory: Negative.   Cardiovascular: Negative.   Gastrointestinal: Negative.   Musculoskeletal: Positive for back pain.  Skin: Negative.   Neurological: Negative.   Hematological: Negative.   Psychiatric/Behavioral: Negative.   All other systems reviewed and are negative.    BP 117/65  Pulse 67  Ht 5\' 3"  (1.6 m)  Wt 135 lb (61.236 kg)  BMI 23.91 kg/m2  Physical Exam  Nursing note and vitals reviewed. Constitutional: She is oriented to person, place, and time. She appears well-developed and well-nourished.  HENT:  Head: Normocephalic.  Nose: Nose normal.  Mouth/Throat: Oropharynx is clear and moist.  Eyes: Conjunctivae are normal. Pupils are equal, round,  and reactive to light.  Neck: Normal range of motion. Neck supple. No JVD present.  Cardiovascular: Normal rate, regular rhythm, S1 normal, S2 normal, normal heart sounds and intact distal pulses.  Exam reveals no gallop and no friction rub.   No murmur heard. Pulmonary/Chest: Effort normal and breath sounds normal. No respiratory distress. She has no wheezes. She has no rales. She exhibits no tenderness.  Abdominal: Soft. Bowel sounds are normal. She exhibits no distension. There is no tenderness.  Musculoskeletal: Normal range of motion. She exhibits no edema and no  tenderness.  Lymphadenopathy:    She has no cervical adenopathy.  Neurological: She is alert and oriented to person, place, and time. Coordination normal.  Skin: Skin is warm and dry. No rash noted. No erythema.  Psychiatric: She has a normal mood and affect. Her behavior is normal. Judgment and thought content normal.         Assessment and Plan

## 2011-08-13 NOTE — Patient Instructions (Signed)
You are doing well. No medication changes were made.  Please call us if you have new issues that need to be addressed before your next appt.  Your physician wants you to follow-up in: 6 months.  You will receive a reminder letter in the mail two months in advance. If you don't receive a letter, please call our office to schedule the follow-up appointment.   

## 2011-09-11 ENCOUNTER — Telehealth: Payer: Self-pay | Admitting: Family Medicine

## 2011-09-11 NOTE — Telephone Encounter (Signed)
If no CP,SOB, ABD pain, and not dizzy on standing (and if feeling well), then I would get OV tomorrow.  If any other symptoms, would need eval at ER.  Thanks.  Have her stop aspirin in meantime.

## 2011-09-11 NOTE — Telephone Encounter (Signed)
Caller: Betty Dunn/Patient; PCP: [facility] Givens Clelia Croft); CB#: [phone]; ; ; Call regarding Diarrhea;  Onset- 09/10/11 Afebrile. Pt has been having dark black diarrhea. Diarrhea x 3 today. Emergent s/s of Gastrointestinal bleeding  protocol r/o. Pt to see provider within 4 hrs. No appts available. Per office nurse Selena Batten,  send message to Dr. Para March and they will call pt she is taking Iron supplement.

## 2011-09-11 NOTE — Telephone Encounter (Signed)
Spoke with patient.  She had some pain in her lower stomach yesterday but none today, feels fine, just having black diarrhea.  Appt scheduled 09/12/11.

## 2011-09-12 ENCOUNTER — Other Ambulatory Visit: Payer: Medicare PPO

## 2011-09-12 ENCOUNTER — Ambulatory Visit (INDEPENDENT_AMBULATORY_CARE_PROVIDER_SITE_OTHER): Payer: Medicare PPO | Admitting: Family Medicine

## 2011-09-12 ENCOUNTER — Encounter: Payer: Self-pay | Admitting: Family Medicine

## 2011-09-12 VITALS — BP 124/60 | HR 73 | Temp 97.8°F | Wt 135.8 lb

## 2011-09-12 DIAGNOSIS — R195 Other fecal abnormalities: Secondary | ICD-10-CM

## 2011-09-12 DIAGNOSIS — K921 Melena: Secondary | ICD-10-CM

## 2011-09-12 LAB — CBC WITH DIFFERENTIAL/PLATELET
Basophils Relative: 0.6 % (ref 0.0–3.0)
HCT: 36.6 % (ref 36.0–46.0)
Hemoglobin: 11.8 g/dL — ABNORMAL LOW (ref 12.0–15.0)
Lymphocytes Relative: 25.2 % (ref 12.0–46.0)
Monocytes Relative: 8.7 % (ref 3.0–12.0)
Neutro Abs: 5.3 10*3/uL (ref 1.4–7.7)
RBC: 3.84 Mil/uL — ABNORMAL LOW (ref 3.87–5.11)

## 2011-09-12 NOTE — Patient Instructions (Addendum)
See Shirlee Limerick about your referral before you leave today. We'll contact you with your lab report. Don't take aspirin in the meantime.  If you have suddenly worse abdominal pain, chest pain, shortness of breath or you get lightheaded, then go to the ER.

## 2011-09-12 NOTE — Progress Notes (Signed)
Tuesday night she had dec in appetite.  Later that night, she had abd pain.  Intermittent belly pain since then.  Abd pain is better after having a BM.  Wed night she had mult episodes of black diarrhea.  Had a black BM today but less than prev on Wednesday.  Is on iron but has stopped aspirin as of yesterday.  No CP, SOB, BLE edema.  Not orthostatic.    PMH and SH reviewed  ROS: See HPI, otherwise noncontributory.  Meds, vitals, and allergies reviewed.   nad ncat Mmm rrr ctab abd soft, not ttp, normal BS Ext w/o edema Skin with normal color, not pale

## 2011-09-14 ENCOUNTER — Encounter: Payer: Self-pay | Admitting: Family Medicine

## 2011-09-14 DIAGNOSIS — R195 Other fecal abnormalities: Secondary | ICD-10-CM | POA: Insufficient documentation

## 2011-09-14 NOTE — Assessment & Plan Note (Signed)
With black diarrhea, IFOB positive today (and shouldn't be false positive from the iron with that kit), and mild anemia.  Hold ASA for now and refer to GI.  She agrees with plan.  >25 min spent with face to face with patient, >50% counseling and/or coordinating care.

## 2011-09-15 ENCOUNTER — Telehealth: Payer: Self-pay | Admitting: Family Medicine

## 2011-09-15 ENCOUNTER — Emergency Department: Payer: Self-pay | Admitting: Emergency Medicine

## 2011-09-15 LAB — BASIC METABOLIC PANEL
Anion Gap: 9 (ref 7–16)
BUN: 19 mg/dL — ABNORMAL HIGH (ref 7–18)
Calcium, Total: 8.7 mg/dL (ref 8.5–10.1)
Chloride: 107 mmol/L (ref 98–107)
Co2: 22 mmol/L (ref 21–32)
Creatinine: 0.96 mg/dL (ref 0.60–1.30)
EGFR (African American): >60
EGFR (Non-African Amer.): 54 — ABNORMAL LOW
Glucose: 96 mg/dL (ref 65–99)
Potassium: 4.2 mmol/L (ref 3.5–5.1)
Sodium: 138 mmol/L (ref 136–145)

## 2011-09-15 LAB — URINALYSIS, COMPLETE
Bilirubin,UR: NEGATIVE
Blood: NEGATIVE
Nitrite: NEGATIVE
Ph: 5 (ref 4.5–8.0)
Protein: NEGATIVE
RBC,UR: NONE SEEN /HPF (ref 0–5)
Squamous Epithelial: 1
WBC UR: 1 /HPF (ref 0–5)

## 2011-09-15 LAB — CBC WITH DIFFERENTIAL/PLATELET
Lymphocyte %: 27 %
MCH: 31.2 pg (ref 26.0–34.0)
MCHC: 32.9 g/dL (ref 32.0–36.0)
MCV: 95 fL (ref 80–100)
Monocyte %: 8.4 %
RDW: 13.7 % (ref 11.5–14.5)

## 2011-09-15 NOTE — Telephone Encounter (Signed)
Noted, thanks.  Will await hospital records

## 2011-09-15 NOTE — Telephone Encounter (Signed)
Patient called to tell you that she is going to the hospital ER at [facility] because she started having really bad abdominal pain again today and you told her to go to ER if it happened again. She is calling 911 because she doesn't have anyone to take her and the pain is bad. She wanted you to know. I also called [facility] and told them. They are on call today so if she is admitted they would see her. Notes were faxed also for her appt with Dr Mechele Collin for 11/11/11.

## 2011-11-26 ENCOUNTER — Other Ambulatory Visit: Payer: Self-pay | Admitting: Family Medicine

## 2011-11-26 DIAGNOSIS — I10 Essential (primary) hypertension: Secondary | ICD-10-CM

## 2011-11-26 DIAGNOSIS — K921 Melena: Secondary | ICD-10-CM

## 2011-12-08 ENCOUNTER — Other Ambulatory Visit (INDEPENDENT_AMBULATORY_CARE_PROVIDER_SITE_OTHER): Payer: Medicare PPO

## 2011-12-08 DIAGNOSIS — K921 Melena: Secondary | ICD-10-CM

## 2011-12-08 DIAGNOSIS — R195 Other fecal abnormalities: Secondary | ICD-10-CM

## 2011-12-08 DIAGNOSIS — I10 Essential (primary) hypertension: Secondary | ICD-10-CM

## 2011-12-08 LAB — CBC WITH DIFFERENTIAL/PLATELET
Basophils Relative: 0.6 % (ref 0.0–3.0)
Eosinophils Absolute: 0.5 10*3/uL (ref 0.0–0.7)
Hemoglobin: 11.8 g/dL — ABNORMAL LOW (ref 12.0–15.0)
Lymphocytes Relative: 25.7 % (ref 12.0–46.0)
MCHC: 32.2 g/dL (ref 30.0–36.0)
MCV: 95.2 fl (ref 78.0–100.0)
Neutro Abs: 4.6 10*3/uL (ref 1.4–7.7)
RBC: 3.83 Mil/uL — ABNORMAL LOW (ref 3.87–5.11)

## 2011-12-08 LAB — COMPREHENSIVE METABOLIC PANEL
AST: 18 U/L (ref 0–37)
BUN: 25 mg/dL — ABNORMAL HIGH (ref 6–23)
Calcium: 9.2 mg/dL (ref 8.4–10.5)
Chloride: 104 mEq/L (ref 96–112)
Creatinine, Ser: 1.1 mg/dL (ref 0.4–1.2)
GFR: 52.15 mL/min — ABNORMAL LOW (ref >60.00)

## 2011-12-08 LAB — LIPID PANEL
Cholesterol: 126 mg/dL (ref 0–200)
LDL Cholesterol: 59 mg/dL (ref 0–99)

## 2011-12-15 ENCOUNTER — Encounter: Payer: Self-pay | Admitting: Family Medicine

## 2011-12-15 ENCOUNTER — Ambulatory Visit (INDEPENDENT_AMBULATORY_CARE_PROVIDER_SITE_OTHER): Payer: Medicare PPO | Admitting: Family Medicine

## 2011-12-15 ENCOUNTER — Other Ambulatory Visit: Payer: Self-pay | Admitting: *Deleted

## 2011-12-15 VITALS — BP 120/58 | HR 78 | Temp 97.9°F | Wt 131.8 lb

## 2011-12-15 DIAGNOSIS — I2589 Other forms of chronic ischemic heart disease: Secondary | ICD-10-CM

## 2011-12-15 DIAGNOSIS — Z23 Encounter for immunization: Secondary | ICD-10-CM

## 2011-12-15 DIAGNOSIS — K648 Other hemorrhoids: Secondary | ICD-10-CM

## 2011-12-15 MED ORDER — LEVOTHYROXINE SODIUM 25 MCG PO TABS: 25.0000 ug | ORAL_TABLET | Freq: Every day | ORAL | Status: DC

## 2011-12-15 MED ORDER — LISINOPRIL 10 MG PO TABS: 10.0000 mg | ORAL_TABLET | Freq: Every day | ORAL | Status: DC

## 2011-12-15 MED ORDER — CARVEDILOL 6.25 MG PO TABS: 6.2500 mg | ORAL_TABLET | Freq: Two times a day (BID) | ORAL | Status: DC

## 2011-12-15 MED ORDER — SIMVASTATIN 40 MG PO TABS: 40.0000 mg | ORAL_TABLET | Freq: Every day | ORAL | Status: DC

## 2011-12-15 MED ORDER — ASPIRIN 81 MG PO TABS: 81.0000 mg | ORAL_TABLET | ORAL | Status: DC

## 2011-12-15 NOTE — Progress Notes (Signed)
H/o heme positive stool.  She's had some superficial irritation with BMs and some occ BRBPR with the BM.  She's had some dark stools intermittently but is on iron.  Much less bleeding and bruising since she stopped ASA at that OV. She declined GI eval.  We talked about this today.  No FH of colon cancer.  She's worried about risk of eval/endoscopy and wanted to defer the f/u and eval.  CBC with hgb 11.8, unchanged.  She is aware that I can't be sure of the source in this situation and the possible implications (ie possible undiagnosed GI CA).  H/o CAD.  Not SOB, no CP, no BLE edema now but occ L lower leg edema at the end of the day.  Occ lightheaded/weak on standing, brief.  No falls.  Lipids controlled.    Meds, vitals, and allergies reviewed.   ROS: See HPI.  Otherwise, noncontributory.  nad ncat Mmm rrr ctab abd soft, not ttp Ext w/o edema

## 2011-12-15 NOTE — Assessment & Plan Note (Signed)
No CP, feeling well and compliant with meds.  If more orthostatic sx we could taper her BP meds but will continue as is for now.  She agrees.  Labs d/w pt.

## 2011-12-15 NOTE — Patient Instructions (Addendum)
Recheck in 6 months.  Take care.  Cal with questions.  Glad to see you.  I would start back on the aspirin 3 times a week, Mon/Wed/Fri.  If you have more bleeding, then let me know.

## 2011-12-15 NOTE — Assessment & Plan Note (Signed)
H/o, she declined f/u with GI.  This is reasonable.  Continue iron for now and restart ASA MWF.  She'll notify me of inc in bleeding.  No abd pain and feels well.  She is aware of her options.

## 2011-12-30 ENCOUNTER — Encounter: Payer: Self-pay | Admitting: Family Medicine

## 2011-12-30 ENCOUNTER — Ambulatory Visit (INDEPENDENT_AMBULATORY_CARE_PROVIDER_SITE_OTHER): Payer: Medicare PPO | Admitting: Family Medicine

## 2011-12-30 VITALS — BP 122/60 | HR 71 | Temp 97.6°F | Wt 130.0 lb

## 2011-12-30 DIAGNOSIS — R195 Other fecal abnormalities: Secondary | ICD-10-CM

## 2011-12-30 DIAGNOSIS — K625 Hemorrhage of anus and rectum: Secondary | ICD-10-CM

## 2011-12-30 LAB — CBC WITH DIFFERENTIAL/PLATELET
Basophils Relative: 0.8 % (ref 0.0–3.0)
Eosinophils Absolute: 0.3 10*3/uL (ref 0.0–0.7)
Lymphocytes Relative: 28.3 % (ref 12.0–46.0)
MCHC: 32.3 g/dL (ref 30.0–36.0)
MCV: 96 fl (ref 78.0–100.0)
Monocytes Absolute: 0.6 10*3/uL (ref 0.1–1.0)
Neutrophils Relative %: 58.8 % (ref 43.0–77.0)
Platelets: 181 10*3/uL (ref 150.0–400.0)
RBC: 3.69 Mil/uL — ABNORMAL LOW (ref 3.87–5.11)
WBC: 7 10*3/uL (ref 4.5–10.5)

## 2011-12-30 LAB — POCT INR: INR: 1

## 2011-12-30 LAB — IBC PANEL
Iron: 45 ug/dL (ref 42–145)
Transferrin: 190.4 mg/dL — ABNORMAL LOW (ref 212.0–360.0)

## 2011-12-30 MED ORDER — SENNOSIDES 8.6 MG PO TABS: ORAL_TABLET | ORAL | Status: DC

## 2011-12-30 NOTE — Progress Notes (Signed)
She was constipated Sunday, had some abd/gas pain.  She finally had a large episode of flatus and BM and had some blood in stool.  Abd pain resolved.   She had another episode of BRBPR with BM last night.  No black stools.   She had declined her prev GI eval.  She isn't interested in invasive w/u.  She has h/o hemorrhoids.  Is taking ASA 81mg  3x/week.    Not orthostatic.  No other bleeding.  Abd pain resolved. Asking about options.    Meds, vitals, and allergies reviewed.   ROS: See HPI.  Otherwise, noncontributory.  nad ncat rrr ctab abd soft, not ttp No edema Skin well perfused

## 2011-12-30 NOTE — Patient Instructions (Addendum)
Go to the lab on the way out.  We'll contact you with your lab report. Keep taking the colace daily and use the senokot as needed.   If you have more frequent episodes or if you get light headed on standing, then let me know.   Take care.  Keep taking the colace, iron and aspirin for now.

## 2011-12-31 NOTE — Assessment & Plan Note (Signed)
She had declined f/u.  This is reasonable.  She is worried about invasive procedures.  I don't have full w/u and she is aware.  This could be from benign source, ie hemorrhoid but I can't exclude CA.  She understands this.  Will follow clinically.  See notes on labs. Would continue iron and ASA for now.  She agrees.  Nontoxic.  Not orthostatic.

## 2012-01-09 ENCOUNTER — Ambulatory Visit (INDEPENDENT_AMBULATORY_CARE_PROVIDER_SITE_OTHER): Payer: Medicare PPO | Admitting: Family Medicine

## 2012-01-09 ENCOUNTER — Encounter: Payer: Self-pay | Admitting: Family Medicine

## 2012-01-09 VITALS — BP 124/60 | HR 79 | Temp 97.9°F | Wt 131.0 lb

## 2012-01-09 DIAGNOSIS — K625 Hemorrhage of anus and rectum: Secondary | ICD-10-CM

## 2012-01-09 DIAGNOSIS — K648 Other hemorrhoids: Secondary | ICD-10-CM

## 2012-01-09 MED ORDER — HYDROCORTISONE ACETATE 25 MG RE SUPP: 25.0000 mg | Freq: Two times a day (BID) | RECTAL | Status: DC

## 2012-01-09 NOTE — Progress Notes (Signed)
No abd pain but some BRBPR again in last 24 hours.  No vomiting.  She feels well o/w. Last BM was yesterday AM, x3.  Back on ASA MWF, but didn't take this AM.    Meds, vitals, and allergies reviewed.   ROS: See HPI.  Otherwise, noncontributory.  nad Chaperoned exam.  Hemorrhoids at the anal verge noted.  Very faint gross blood on pad

## 2012-01-09 NOTE — Patient Instructions (Addendum)
Stay off the aspirin for now, use the suppositories as needed.   See Shirlee Limerick about your referral before you leave today. Take care.

## 2012-01-11 NOTE — Assessment & Plan Note (Signed)
This has become troubling enough to warrant intervention.  She wants to limit procedures.  I would ask if GI can see for her anoscopy/sigmoid for consideration of banding.  She agrees with plan.

## 2012-01-21 ENCOUNTER — Telehealth: Payer: Self-pay | Admitting: Family Medicine

## 2012-01-21 DIAGNOSIS — K649 Unspecified hemorrhoids: Secondary | ICD-10-CM

## 2012-01-21 NOTE — Telephone Encounter (Signed)
Triage nurse Melissa called back to say that Dr Mechele Collin wants you to directly page him to speak to him about Betty Dunn and getting her seen sooner than December 3rd. Dr Iline Oven pager [phone]. Also I have received the Urgent Care note from her visit last Saturday to [facility] and I have put it in your in box.

## 2012-01-21 NOTE — Telephone Encounter (Signed)
I had talked to patient about GI referral for likely hemorrhoids.  I talked with Dr. Mechele Collin about this and he doesn't do the banding for hemorrhoids.  I didn't realize this.  He rec'd Dr. Renda Rolls for this.  It would be reasonable to get pt referred to Dr. Katrinka Blazing.  She may not need to keep the 03/02/12 OV with Dr. Mechele Collin.  Thanks.

## 2012-01-21 NOTE — Telephone Encounter (Signed)
Patient called to say that she continues to have rectal bleeding and went to [facility] last Saturday. Her appt is December 3rd at [facility] and I called them to ask them to see her sooner. Their Triage nurse will be calling me back regarding a sooner appt with them. She has refused in the past to go to [facility] in [place] due to having to have someone to bring her to [facility] for any appt. Have told her that we could get her in soon with a P.A. In [place] but she says no. WE also made her an appt last year with Gavin Potters which she cancelled and never went to the appt.

## 2012-02-09 ENCOUNTER — Other Ambulatory Visit: Payer: Self-pay | Admitting: *Deleted

## 2012-02-09 MED ORDER — HYDROCORTISONE ACETATE 25 MG RE SUPP: 25.0000 mg | Freq: Two times a day (BID) | RECTAL | Status: DC

## 2012-03-31 HISTORY — PX: COLOSTOMY: SHX63

## 2012-06-14 ENCOUNTER — Ambulatory Visit: Payer: Medicare PPO | Admitting: Family Medicine

## 2012-06-17 ENCOUNTER — Ambulatory Visit (INDEPENDENT_AMBULATORY_CARE_PROVIDER_SITE_OTHER): Payer: Medicare PPO | Admitting: Family Medicine

## 2012-06-17 ENCOUNTER — Encounter: Payer: Self-pay | Admitting: Family Medicine

## 2012-06-17 VITALS — BP 102/50 | HR 76 | Temp 97.7°F | Wt 132.5 lb

## 2012-06-17 DIAGNOSIS — I10 Essential (primary) hypertension: Secondary | ICD-10-CM

## 2012-06-17 NOTE — Assessment & Plan Note (Signed)
Controlled, continue as is with current meds.  No orthostasis.

## 2012-06-17 NOTE — Progress Notes (Signed)
She has BM usually after eating, occ loose, occ normal.  Passing less blood after rubber band hemorrhoid tx.  Colonoscopy was declined by patient.  She has some fiber in diet.  She hasn't changed her diet.  She isn't taking metamucil or imodium.    Hypertension:    Using medication without problems or lightheadedness: yes Chest pain with exertion:no Edema:no Short of breath:no "I feel fine."  Meds, vitals, and allergies reviewed.   PMH and SH reviewed  ROS: See HPI.  Otherwise negative.    GEN: nad, alert and oriented HEENT: mucous membranes moist NECK: supple w/o LA CV: rrr. Well healed midline scar noted.  PULM: ctab, no inc wob ABD: soft, +bs EXT: no edema SKIN: no acute rash

## 2012-06-17 NOTE — Patient Instructions (Addendum)
Try taking metamucil once a day (for a few days).  If that doesn't help with the bowel troubles, then try taking imodium once a day.  I think that either one could help.  Recheck in 6 months.  We can do labs at that time if needed.  Glad to see you. Call back sooner if needed.

## 2012-06-17 NOTE — Assessment & Plan Note (Signed)
Improved, now with some loose stools.  Try metamucil once a day (for a few days). If that doesn't help, then try imodium once a day.  F/u prn.  Recheck in 6 months.  She agrees.

## 2012-07-23 ENCOUNTER — Emergency Department: Payer: Self-pay | Admitting: Emergency Medicine

## 2012-07-23 LAB — COMPREHENSIVE METABOLIC PANEL
Albumin: 3.1 g/dL — ABNORMAL LOW (ref 3.4–5.0)
BUN: 18 mg/dL (ref 7–18)
Chloride: 104 mmol/L (ref 98–107)
EGFR (Non-African Amer.): 53 — ABNORMAL LOW
Glucose: 105 mg/dL — ABNORMAL HIGH (ref 65–99)
Potassium: 3.9 mmol/L (ref 3.5–5.1)
SGOT(AST): 23 U/L (ref 15–37)
Sodium: 137 mmol/L (ref 136–145)
Total Protein: 6.3 g/dL — ABNORMAL LOW (ref 6.4–8.2)

## 2012-07-23 LAB — URINALYSIS, COMPLETE
Bacteria: NONE SEEN
Bilirubin,UR: NEGATIVE
Glucose,UR: NEGATIVE mg/dL (ref 0–75)
Ketone: NEGATIVE
Ph: 5 (ref 4.5–8.0)
Protein: NEGATIVE
Squamous Epithelial: <1
WBC UR: 1 /HPF (ref 0–5)

## 2012-07-23 LAB — PROTIME-INR
INR: 1
Prothrombin Time: 13.2 secs (ref 11.5–14.7)

## 2012-07-23 LAB — CBC
MCH: 30 pg (ref 26.0–34.0)
MCH: 30.5 pg (ref 26.0–34.0)
Platelet: 184 10*3/uL (ref 150–440)
RBC: 3.58 10*6/uL — ABNORMAL LOW (ref 3.80–5.20)
RBC: 3.53 10*6/uL — ABNORMAL LOW (ref 3.80–5.20)
RDW: 13.9 % (ref 11.5–14.5)
RDW: 13.8 % (ref 11.5–14.5)
WBC: 8.7 10*3/uL (ref 3.6–11.0)
WBC: 7.3 10*3/uL (ref 3.6–11.0)

## 2012-07-23 LAB — LIPASE, BLOOD: Lipase: 655 U/L — ABNORMAL HIGH (ref 73–393)

## 2012-08-03 ENCOUNTER — Inpatient Hospital Stay: Payer: Self-pay | Admitting: Specialist

## 2012-08-03 LAB — CBC
HCT: 32.9 % — ABNORMAL LOW
HGB: 10.8 g/dL — ABNORMAL LOW
MCH: 30.1 pg
MCHC: 32.9 g/dL
MCV: 91 fL
Platelet: 186 x10 3/mm 3
RBC: 3.6 X10 6/mm 3 — ABNORMAL LOW
RDW: 14.3 %
WBC: 6.6 x10 3/mm 3

## 2012-08-03 LAB — COMPREHENSIVE METABOLIC PANEL WITH GFR
Albumin: 3 g/dL — ABNORMAL LOW
Alkaline Phosphatase: 79 U/L
Anion Gap: 5 — ABNORMAL LOW
BUN: 16 mg/dL
Bilirubin,Total: 0.5 mg/dL
Calcium, Total: 8.7 mg/dL
Chloride: 104 mmol/L
Co2: 28 mmol/L
Creatinine: 1.03 mg/dL
EGFR (African American): 57 — ABNORMAL LOW
EGFR (Non-African Amer.): 49 — ABNORMAL LOW
Glucose: 121 mg/dL — ABNORMAL HIGH
Osmolality: 276
Potassium: 4 mmol/L
SGOT(AST): 21 U/L
SGPT (ALT): 14 U/L
Sodium: 137 mmol/L
Total Protein: 6.1 g/dL — ABNORMAL LOW

## 2012-08-03 LAB — URINALYSIS, COMPLETE
Glucose,UR: NEGATIVE mg/dL (ref 0–75)
Ketone: NEGATIVE
Nitrite: NEGATIVE
Protein: NEGATIVE
Squamous Epithelial: 2
WBC UR: 74 /HPF (ref 0–5)

## 2012-08-04 LAB — CBC WITH DIFFERENTIAL/PLATELET
Basophil %: 0.9 %
Eosinophil #: 0.2 10*3/uL (ref 0.0–0.7)
HCT: 36.5 % (ref 35.0–47.0)
Lymphocyte %: 27.5 %
MCH: 30.6 pg (ref 26.0–34.0)
MCHC: 33.8 g/dL (ref 32.0–36.0)
MCV: 91 fL (ref 80–100)
Monocyte #: 0.7 x10 3/mm (ref 0.2–0.9)
Neutrophil #: 6.2 10*3/uL (ref 1.4–6.5)
Neutrophil %: 62.4 %
RDW: 14 % (ref 11.5–14.5)
WBC: 9.9 10*3/uL (ref 3.6–11.0)

## 2012-08-04 LAB — BASIC METABOLIC PANEL
BUN: 15 mg/dL (ref 7–18)
Calcium, Total: 9 mg/dL (ref 8.5–10.1)
Co2: 30 mmol/L (ref 21–32)
Creatinine: 0.99 mg/dL (ref 0.60–1.30)
EGFR (African American): 59 — ABNORMAL LOW
EGFR (Non-African Amer.): 51 — ABNORMAL LOW
Glucose: 96 mg/dL (ref 65–99)
Osmolality: 278 (ref 275–301)

## 2012-08-05 DIAGNOSIS — Z01810 Encounter for preprocedural cardiovascular examination: Secondary | ICD-10-CM

## 2012-08-05 DIAGNOSIS — I10 Essential (primary) hypertension: Secondary | ICD-10-CM

## 2012-08-07 DIAGNOSIS — I509 Heart failure, unspecified: Secondary | ICD-10-CM

## 2012-08-10 ENCOUNTER — Ambulatory Visit: Payer: Self-pay | Admitting: Hematology and Oncology

## 2012-08-10 ENCOUNTER — Telehealth: Payer: Self-pay | Admitting: Family Medicine

## 2012-08-10 NOTE — Telephone Encounter (Signed)
Patient advised.

## 2012-08-10 NOTE — Telephone Encounter (Signed)
Yes, reasonable to try dulcolax for now and let us know if not improved.  Thanks.

## 2012-08-10 NOTE — Telephone Encounter (Signed)
Patient Information:  Caller Name: [facility]  Phone: [phone]  Patient: Betty Dunn, Betty Dunn  Gender: Female  DOB: 02-09-1923  Age: 79 Years  PCP: Crawford Givens Clelia Croft) [facility])  Office Follow Up:  Does the office need to follow up with this patient?: Yes  Instructions For The Office: Home care advise given to Patient.  She wants to use Dulcolax supp. due to current treatment seeking guidance from physician. Please contact patient.  RN Note:  Home care advise given to Patient.  She wants to use Dulcolax supp. due to current treatment seeking guidance from physician. Please contact patient.  Symptoms  Reason For Call & Symptoms: Patient states rectal bleeding two weeks ago and she was admitted to the Hospital and was admitted (Four day stay) . They found a "hard Mass in her colon".  Patient states she is constipated. She has not had a BM since Thursday after leaving the hospital.  She is scheduled to see a surgeon (Dr.Randy Michela Pitcher). She wants to know if she can take a Dulcolax ?  She is not in any pain, no distention. she does not hurt "just wants to have a BM"/   Eating and drinking normally.  She wants approval before taking OTC for constipation.  Reviewed EPIC. No reports concerning patient condition. Did not advise.  Reviewed Health History In EMR: Yes  Reviewed Medications In EMR: Yes  Reviewed Allergies In EMR: Yes  Reviewed Surgeries / Procedures: Yes  Date of Onset of Symptoms: 08/05/2012  Guideline(s) Used:  Constipation  Disposition Per Guideline:   Home Care  Reason For Disposition Reached:   Mild constipation  Advice Given:  General Constipation Instructions:  Eat a high fiber diet.  Drink adequate liquids.  High Fiber Diet:  Try to eat fresh fruit and vegetables at each meal (peas, prunes, citrus, apples, beans, corn).  Eat more grain foods (bran flakes, bran muffins, graham crackers, oatmeal, brown rice, and whole wheat bread). Popcorn is a source of fiber.  Liquids:  Drink 6-8 glasses of water a day (Caution: certain medical conditions require fluid restriction).  Prune juice is a natural laxative.  Avoid alcohol.  Call Back If:  Constipation continues (i.e., less than 3 BMs / week or straining more than 25% of the time) after following care advice for constipation for 2 weeks  You become worse  Patient Will Follow Care Advice:  YES

## 2012-08-16 ENCOUNTER — Ambulatory Visit: Payer: Self-pay | Admitting: Hematology and Oncology

## 2012-08-27 ENCOUNTER — Ambulatory Visit: Payer: Self-pay | Admitting: Hematology and Oncology

## 2012-08-29 ENCOUNTER — Ambulatory Visit: Payer: Self-pay | Admitting: Hematology and Oncology

## 2012-08-29 ENCOUNTER — Ambulatory Visit: Payer: Self-pay | Admitting: Radiation Oncology

## 2012-08-30 ENCOUNTER — Ambulatory Visit: Payer: Self-pay | Admitting: Surgery

## 2012-08-30 LAB — CBC WITH DIFFERENTIAL/PLATELET
Eosinophil %: 3.6 %
HCT: 33.1 % — ABNORMAL LOW (ref 35.0–47.0)
Lymphocyte #: 2 10*3/uL (ref 1.0–3.6)
MCV: 91 fL (ref 80–100)
Monocyte #: 0.8 x10 3/mm (ref 0.2–0.9)
Monocyte %: 9.6 %
Neutrophil #: 4.9 10*3/uL (ref 1.4–6.5)
Platelet: 192 10*3/uL (ref 150–440)
RBC: 3.64 10*6/uL — ABNORMAL LOW (ref 3.80–5.20)
RDW: 14.5 % (ref 11.5–14.5)

## 2012-08-30 LAB — BASIC METABOLIC PANEL
Anion Gap: 4 — ABNORMAL LOW (ref 7–16)
BUN: 18 mg/dL (ref 7–18)
Calcium, Total: 8.6 mg/dL (ref 8.5–10.1)
Co2: 27 mmol/L (ref 21–32)
Osmolality: 272 (ref 275–301)

## 2012-08-31 ENCOUNTER — Inpatient Hospital Stay: Payer: Self-pay | Admitting: Surgery

## 2012-09-01 LAB — CBC WITH DIFFERENTIAL/PLATELET
Basophil #: 0 10*3/uL (ref 0.0–0.1)
Basophil %: 0.2 %
Eosinophil #: 0 10*3/uL (ref 0.0–0.7)
Eosinophil %: 0.1 %
HCT: 32.1 % — ABNORMAL LOW (ref 35.0–47.0)
HGB: 10.5 g/dL — ABNORMAL LOW (ref 12.0–16.0)
Lymphocyte %: 12.2 %
MCH: 29.9 pg (ref 26.0–34.0)
MCHC: 32.8 g/dL (ref 32.0–36.0)
Monocyte #: 1 x10 3/mm — ABNORMAL HIGH (ref 0.2–0.9)
Monocyte %: 6.1 %
Neutrophil #: 12.7 10*3/uL — ABNORMAL HIGH (ref 1.4–6.5)
Neutrophil %: 81.4 %
RBC: 3.52 10*6/uL — ABNORMAL LOW (ref 3.80–5.20)

## 2012-09-01 LAB — BASIC METABOLIC PANEL
BUN: 17 mg/dL (ref 7–18)
Calcium, Total: 8.6 mg/dL (ref 8.5–10.1)
Chloride: 103 mmol/L (ref 98–107)
Co2: 27 mmol/L (ref 21–32)
Creatinine: 1.01 mg/dL (ref 0.60–1.30)
EGFR (African American): 58 — ABNORMAL LOW
Glucose: 125 mg/dL — ABNORMAL HIGH (ref 65–99)
Potassium: 5.1 mmol/L (ref 3.5–5.1)
Sodium: 134 mmol/L — ABNORMAL LOW (ref 136–145)

## 2012-09-04 LAB — CBC WITH DIFFERENTIAL/PLATELET
Basophil #: 0.1 10*3/uL (ref 0.0–0.1)
Eosinophil %: 8.2 %
HGB: 10.5 g/dL — ABNORMAL LOW (ref 12.0–16.0)
Lymphocyte %: 23.8 %
MCH: 30.5 pg (ref 26.0–34.0)
MCHC: 33.8 g/dL (ref 32.0–36.0)
MCV: 90 fL (ref 80–100)
Monocyte %: 8.6 %
Neutrophil %: 58.6 %
RDW: 14.2 % (ref 11.5–14.5)
WBC: 8.2 10*3/uL (ref 3.6–11.0)

## 2012-09-06 ENCOUNTER — Encounter: Payer: Self-pay | Admitting: Family Medicine

## 2012-09-06 DIAGNOSIS — C787 Secondary malignant neoplasm of liver and intrahepatic bile duct: Secondary | ICD-10-CM | POA: Insufficient documentation

## 2012-09-06 DIAGNOSIS — C20 Malignant neoplasm of rectum: Secondary | ICD-10-CM

## 2012-09-06 DIAGNOSIS — Z933 Colostomy status: Secondary | ICD-10-CM | POA: Insufficient documentation

## 2012-09-21 LAB — CBC CANCER CENTER
Basophil #: 0 x10 3/mm (ref 0.0–0.1)
Basophil %: 0.8 %
Eosinophil %: 7.3 %
HCT: 30.2 % — ABNORMAL LOW (ref 35.0–47.0)
Lymphocyte %: 14.9 %
MCH: 30.6 pg (ref 26.0–34.0)
MCV: 91 fL (ref 80–100)
Monocyte %: 8.5 %
Neutrophil #: 3.8 x10 3/mm (ref 1.4–6.5)
Neutrophil %: 68.5 %
Platelet: 191 x10 3/mm (ref 150–440)
WBC: 5.5 x10 3/mm (ref 3.6–11.0)

## 2012-09-21 LAB — COMPREHENSIVE METABOLIC PANEL
Alkaline Phosphatase: 80 U/L (ref 50–136)
Bilirubin,Total: 0.5 mg/dL (ref 0.2–1.0)
Calcium, Total: 8.6 mg/dL (ref 8.5–10.1)
Chloride: 100 mmol/L (ref 98–107)
Co2: 26 mmol/L (ref 21–32)
Creatinine: 1.28 mg/dL (ref 0.60–1.30)
EGFR (African American): 44 — ABNORMAL LOW
EGFR (Non-African Amer.): 38 — ABNORMAL LOW
Glucose: 136 mg/dL — ABNORMAL HIGH (ref 65–99)
Osmolality: 273 (ref 275–301)
Potassium: 3.7 mmol/L (ref 3.5–5.1)
SGPT (ALT): 15 U/L (ref 12–78)
Total Protein: 5.9 g/dL — ABNORMAL LOW (ref 6.4–8.2)

## 2012-09-28 ENCOUNTER — Ambulatory Visit: Payer: Self-pay | Admitting: Hematology and Oncology

## 2012-09-29 LAB — COMPREHENSIVE METABOLIC PANEL
Albumin: 2.7 g/dL — ABNORMAL LOW (ref 3.4–5.0)
Alkaline Phosphatase: 83 U/L (ref 50–136)
Anion Gap: 12 (ref 7–16)
Bilirubin,Total: 0.6 mg/dL (ref 0.2–1.0)
Calcium, Total: 8.5 mg/dL (ref 8.5–10.1)
Co2: 22 mmol/L (ref 21–32)
Creatinine: 1.27 mg/dL (ref 0.60–1.30)
EGFR (African American): 44 — ABNORMAL LOW
Glucose: 118 mg/dL — ABNORMAL HIGH (ref 65–99)
Osmolality: 271 (ref 275–301)
Potassium: 3.7 mmol/L (ref 3.5–5.1)
SGOT(AST): 15 U/L (ref 15–37)
SGPT (ALT): 12 U/L (ref 12–78)
Total Protein: 6 g/dL — ABNORMAL LOW (ref 6.4–8.2)

## 2012-09-29 LAB — CBC
HCT: 30.4 % — ABNORMAL LOW (ref 35.0–47.0)
HGB: 10.2 g/dL — ABNORMAL LOW (ref 12.0–16.0)
Platelet: 150 10*3/uL (ref 150–440)
RDW: 14.3 % (ref 11.5–14.5)

## 2012-09-29 LAB — CK TOTAL AND CKMB (NOT AT ARMC)
CK, Total: 50 U/L (ref 21–215)
CK-MB: 1.1 ng/mL (ref 0.5–3.6)

## 2012-09-29 LAB — URINALYSIS, COMPLETE
Hyaline Cast: 7
Ph: 5 (ref 4.5–8.0)
Protein: NEGATIVE
RBC,UR: 1 /HPF (ref 0–5)
Specific Gravity: 1.013 (ref 1.003–1.030)
WBC UR: 5 /HPF (ref 0–5)

## 2012-09-29 LAB — MAGNESIUM: Magnesium: 1.4 mg/dL — ABNORMAL LOW

## 2012-09-29 LAB — TROPONIN I: Troponin-I: <0.02 ng/mL

## 2012-09-30 ENCOUNTER — Observation Stay: Payer: Self-pay | Admitting: Internal Medicine

## 2012-09-30 LAB — CK TOTAL AND CKMB (NOT AT ARMC)
CK, Total: 46 U/L (ref 21–215)
CK, Total: 45 U/L (ref 21–215)
CK-MB: 2 ng/mL (ref 0.5–3.6)

## 2012-09-30 LAB — CBC WITH DIFFERENTIAL/PLATELET
Basophil #: 0 10*3/uL (ref 0.0–0.1)
Eosinophil #: 0.3 10*3/uL (ref 0.0–0.7)
Eosinophil %: 5.1 %
HCT: 28.8 % — ABNORMAL LOW (ref 35.0–47.0)
Lymphocyte #: 0.8 10*3/uL — ABNORMAL LOW (ref 1.0–3.6)
Lymphocyte %: 13.2 %
MCH: 30.4 pg (ref 26.0–34.0)
MCV: 90 fL (ref 80–100)
Monocyte %: 11.6 %
Neutrophil #: 4 10*3/uL (ref 1.4–6.5)
Neutrophil %: 69.9 %
RBC: 3.21 10*6/uL — ABNORMAL LOW (ref 3.80–5.20)
WBC: 5.7 10*3/uL (ref 3.6–11.0)

## 2012-09-30 LAB — TROPONIN I
Troponin-I: <0.02 ng/mL
Troponin-I: <0.02 ng/mL

## 2012-09-30 LAB — BASIC METABOLIC PANEL
BUN: 20 mg/dL — ABNORMAL HIGH (ref 7–18)
Creatinine: 1.1 mg/dL (ref 0.60–1.30)
EGFR (African American): 52 — ABNORMAL LOW
EGFR (Non-African Amer.): 45 — ABNORMAL LOW
Potassium: 3.7 mmol/L (ref 3.5–5.1)
Sodium: 138 mmol/L (ref 136–145)

## 2012-10-05 LAB — CBC WITH DIFFERENTIAL/PLATELET
Basophil %: 0.6 %
Eosinophil #: 0.5 10*3/uL (ref 0.0–0.7)
HCT: 27.6 % — ABNORMAL LOW (ref 35.0–47.0)
Lymphocyte #: 0.7 10*3/uL — ABNORMAL LOW (ref 1.0–3.6)
Lymphocyte %: 12.1 %
MCHC: 33 g/dL (ref 32.0–36.0)
MCV: 92 fL (ref 80–100)
Monocyte #: 0.8 x10 3/mm (ref 0.2–0.9)
Monocyte %: 14.6 %
Neutrophil #: 3.7 10*3/uL (ref 1.4–6.5)
Neutrophil %: 63.4 %
Platelet: 186 10*3/uL (ref 150–440)
RBC: 3.01 10*6/uL — ABNORMAL LOW (ref 3.80–5.20)

## 2012-10-05 LAB — BASIC METABOLIC PANEL
Anion Gap: 8 (ref 7–16)
BUN: 21 mg/dL — ABNORMAL HIGH (ref 7–18)
Co2: 26 mmol/L (ref 21–32)
Creatinine: 1.11 mg/dL (ref 0.60–1.30)
EGFR (Non-African Amer.): 45 — ABNORMAL LOW
Glucose: 108 mg/dL — ABNORMAL HIGH (ref 65–99)
Osmolality: 275 (ref 275–301)
Potassium: 4.5 mmol/L (ref 3.5–5.1)
Sodium: 136 mmol/L (ref 136–145)

## 2012-10-06 ENCOUNTER — Other Ambulatory Visit: Payer: Self-pay | Admitting: *Deleted

## 2012-10-06 MED ORDER — POTASSIUM CHLORIDE 20 MEQ/15ML (10%) PO SOLN: ORAL | Status: DC

## 2012-10-06 NOTE — Telephone Encounter (Signed)
Yes, sent. Thanks.  ?

## 2012-10-06 NOTE — Telephone Encounter (Signed)
Pt only takes prn when taking lasix, but last rx was written 07/15/10. Is it ok to refill?

## 2012-10-07 ENCOUNTER — Telehealth: Payer: Self-pay | Admitting: *Deleted

## 2012-10-07 ENCOUNTER — Ambulatory Visit: Payer: Medicare PPO | Admitting: Family Medicine

## 2012-10-07 NOTE — Telephone Encounter (Signed)
Patient had hospital follow up appt today and canceled.  Dr. Para March asked me to call her to find out if she's okay.  Patient says the [facility] nurse came in this morning and didn't leave in time for her to come to this appt and she has to go to radiation this afternoon.  She says her days are filled right now with treatment appts but that she will call to R/S as soon as she can.  I assured her that we want her to do what she needs to do and that we are here if she needs [place].

## 2012-10-07 NOTE — Telephone Encounter (Signed)
Betty Dunn checking on status of refill; advised done.

## 2012-10-07 NOTE — Telephone Encounter (Signed)
Agreed, thanks

## 2012-10-20 LAB — BASIC METABOLIC PANEL
Anion Gap: 5 — ABNORMAL LOW (ref 7–16)
Chloride: 94 mmol/L — ABNORMAL LOW (ref 98–107)
Creatinine: 1.42 mg/dL — ABNORMAL HIGH (ref 0.60–1.30)
EGFR (African American): 38 — ABNORMAL LOW
EGFR (Non-African Amer.): 33 — ABNORMAL LOW
Glucose: 107 mg/dL — ABNORMAL HIGH (ref 65–99)
Osmolality: 257 (ref 275–301)
Potassium: 4.2 mmol/L (ref 3.5–5.1)
Sodium: 127 mmol/L — ABNORMAL LOW (ref 136–145)

## 2012-10-20 LAB — CBC CANCER CENTER
Eosinophil #: 0.1 x10 3/mm (ref 0.0–0.7)
Eosinophil %: 2.2 %
HCT: 26.4 % — ABNORMAL LOW (ref 35.0–47.0)
HGB: 9 g/dL — ABNORMAL LOW (ref 12.0–16.0)
Lymphocyte #: 0.4 x10 3/mm — ABNORMAL LOW (ref 1.0–3.6)
Lymphocyte %: 5.6 %
MCH: 30.4 pg (ref 26.0–34.0)
MCV: 89 fL (ref 80–100)
Monocyte #: 0.7 x10 3/mm (ref 0.2–0.9)
Monocyte %: 10.2 %
Neutrophil %: 81.7 %
RBC: 2.96 10*6/uL — ABNORMAL LOW (ref 3.80–5.20)
RDW: 14.6 % — ABNORMAL HIGH (ref 11.5–14.5)

## 2012-10-21 LAB — CEA: CEA: 8.8 ng/mL — ABNORMAL HIGH (ref 0.0–4.7)

## 2012-10-29 ENCOUNTER — Ambulatory Visit: Payer: Self-pay | Admitting: Hematology and Oncology

## 2012-11-02 LAB — COMPREHENSIVE METABOLIC PANEL
Albumin: 2 g/dL — ABNORMAL LOW (ref 3.4–5.0)
Anion Gap: 3 — ABNORMAL LOW (ref 7–16)
Bilirubin,Total: 0.4 mg/dL (ref 0.2–1.0)
Calcium, Total: 7.8 mg/dL — ABNORMAL LOW (ref 8.5–10.1)
Chloride: 96 mmol/L — ABNORMAL LOW (ref 98–107)
EGFR (African American): 59 — ABNORMAL LOW
Osmolality: 262 (ref 275–301)
Potassium: 3.5 mmol/L (ref 3.5–5.1)
SGPT (ALT): 10 U/L — ABNORMAL LOW (ref 12–78)
Sodium: 130 mmol/L — ABNORMAL LOW (ref 136–145)

## 2012-11-02 LAB — CBC CANCER CENTER
Eosinophil #: 0.1 x10 3/mm (ref 0.0–0.7)
HCT: 26.7 % — ABNORMAL LOW (ref 35.0–47.0)
HGB: 9 g/dL — ABNORMAL LOW (ref 12.0–16.0)
Lymphocyte #: 0.8 x10 3/mm — ABNORMAL LOW (ref 1.0–3.6)
Lymphocyte %: 17.2 %
MCH: 29.8 pg (ref 26.0–34.0)
MCHC: 33.9 g/dL (ref 32.0–36.0)
MCV: 88 fL (ref 80–100)
Monocyte %: 9.5 %
Neutrophil %: 70.7 %
RBC: 3.03 10*6/uL — ABNORMAL LOW (ref 3.80–5.20)
RDW: 14.7 % — ABNORMAL HIGH (ref 11.5–14.5)
WBC: 4.8 x10 3/mm (ref 3.6–11.0)

## 2012-11-29 ENCOUNTER — Ambulatory Visit: Payer: Self-pay | Admitting: Hematology and Oncology

## 2012-12-01 LAB — COMPREHENSIVE METABOLIC PANEL
Alkaline Phosphatase: 93 U/L (ref 50–136)
Anion Gap: 6 — ABNORMAL LOW (ref 7–16)
BUN: 9 mg/dL (ref 7–18)
Bilirubin,Total: 0.6 mg/dL (ref 0.2–1.0)
Co2: 30 mmol/L (ref 21–32)
Creatinine: 1.05 mg/dL (ref 0.60–1.30)
Osmolality: 268 (ref 275–301)
Potassium: 3.6 mmol/L (ref 3.5–5.1)
SGPT (ALT): 10 U/L — ABNORMAL LOW (ref 12–78)
Sodium: 134 mmol/L — ABNORMAL LOW (ref 136–145)
Total Protein: 5.3 g/dL — ABNORMAL LOW (ref 6.4–8.2)

## 2012-12-01 LAB — CBC CANCER CENTER
Basophil #: 0 x10 3/mm (ref 0.0–0.1)
Basophil %: 0.7 %
Eosinophil #: 0.2 x10 3/mm (ref 0.0–0.7)
Eosinophil %: 3.4 %
Lymphocyte #: 2.1 x10 3/mm (ref 1.0–3.6)
Lymphocyte %: 30.7 %
MCHC: 32.9 g/dL (ref 32.0–36.0)
MCV: 89 fL (ref 80–100)
Monocyte %: 10 %
Neutrophil %: 55.2 %
RDW: 17.2 % — ABNORMAL HIGH (ref 11.5–14.5)

## 2012-12-29 ENCOUNTER — Ambulatory Visit: Payer: Self-pay | Admitting: Hematology and Oncology

## 2013-01-06 ENCOUNTER — Emergency Department: Payer: Self-pay | Admitting: Emergency Medicine

## 2013-01-13 LAB — COMPREHENSIVE METABOLIC PANEL
Albumin: 2.5 g/dL — ABNORMAL LOW (ref 3.4–5.0)
Anion Gap: 5 — ABNORMAL LOW (ref 7–16)
BUN: 13 mg/dL (ref 7–18)
Chloride: 98 mmol/L (ref 98–107)
EGFR (African American): 59 — ABNORMAL LOW
EGFR (Non-African Amer.): 51 — ABNORMAL LOW
Glucose: 115 mg/dL — ABNORMAL HIGH (ref 65–99)
Osmolality: 271 (ref 275–301)
SGOT(AST): 16 U/L (ref 15–37)
SGPT (ALT): 13 U/L (ref 12–78)
Total Protein: 5.7 g/dL — ABNORMAL LOW (ref 6.4–8.2)

## 2013-01-13 LAB — CBC CANCER CENTER
Basophil #: 0 x10 3/mm (ref 0.0–0.1)
Eosinophil #: 0.1 x10 3/mm (ref 0.0–0.7)
Eosinophil %: 2.1 %
HCT: 32.1 % — ABNORMAL LOW (ref 35.0–47.0)
HGB: 10.6 g/dL — ABNORMAL LOW (ref 12.0–16.0)
Lymphocyte %: 30.1 %
MCH: 30.3 pg (ref 26.0–34.0)
MCHC: 33 g/dL (ref 32.0–36.0)
Monocyte %: 11.4 %
Neutrophil %: 55.6 %
RBC: 3.51 10*6/uL — ABNORMAL LOW (ref 3.80–5.20)

## 2013-01-14 LAB — CEA: CEA: 6.9 ng/mL — ABNORMAL HIGH (ref 0.0–4.7)

## 2013-01-14 IMAGING — CR DG CHEST 2V
2 series · 2 of 2 positions shown · non-contrast
Comparison: None.

CLINICAL DATA: Pre CABG evaluation.

CHEST - 2 VIEW

[w chest pa]
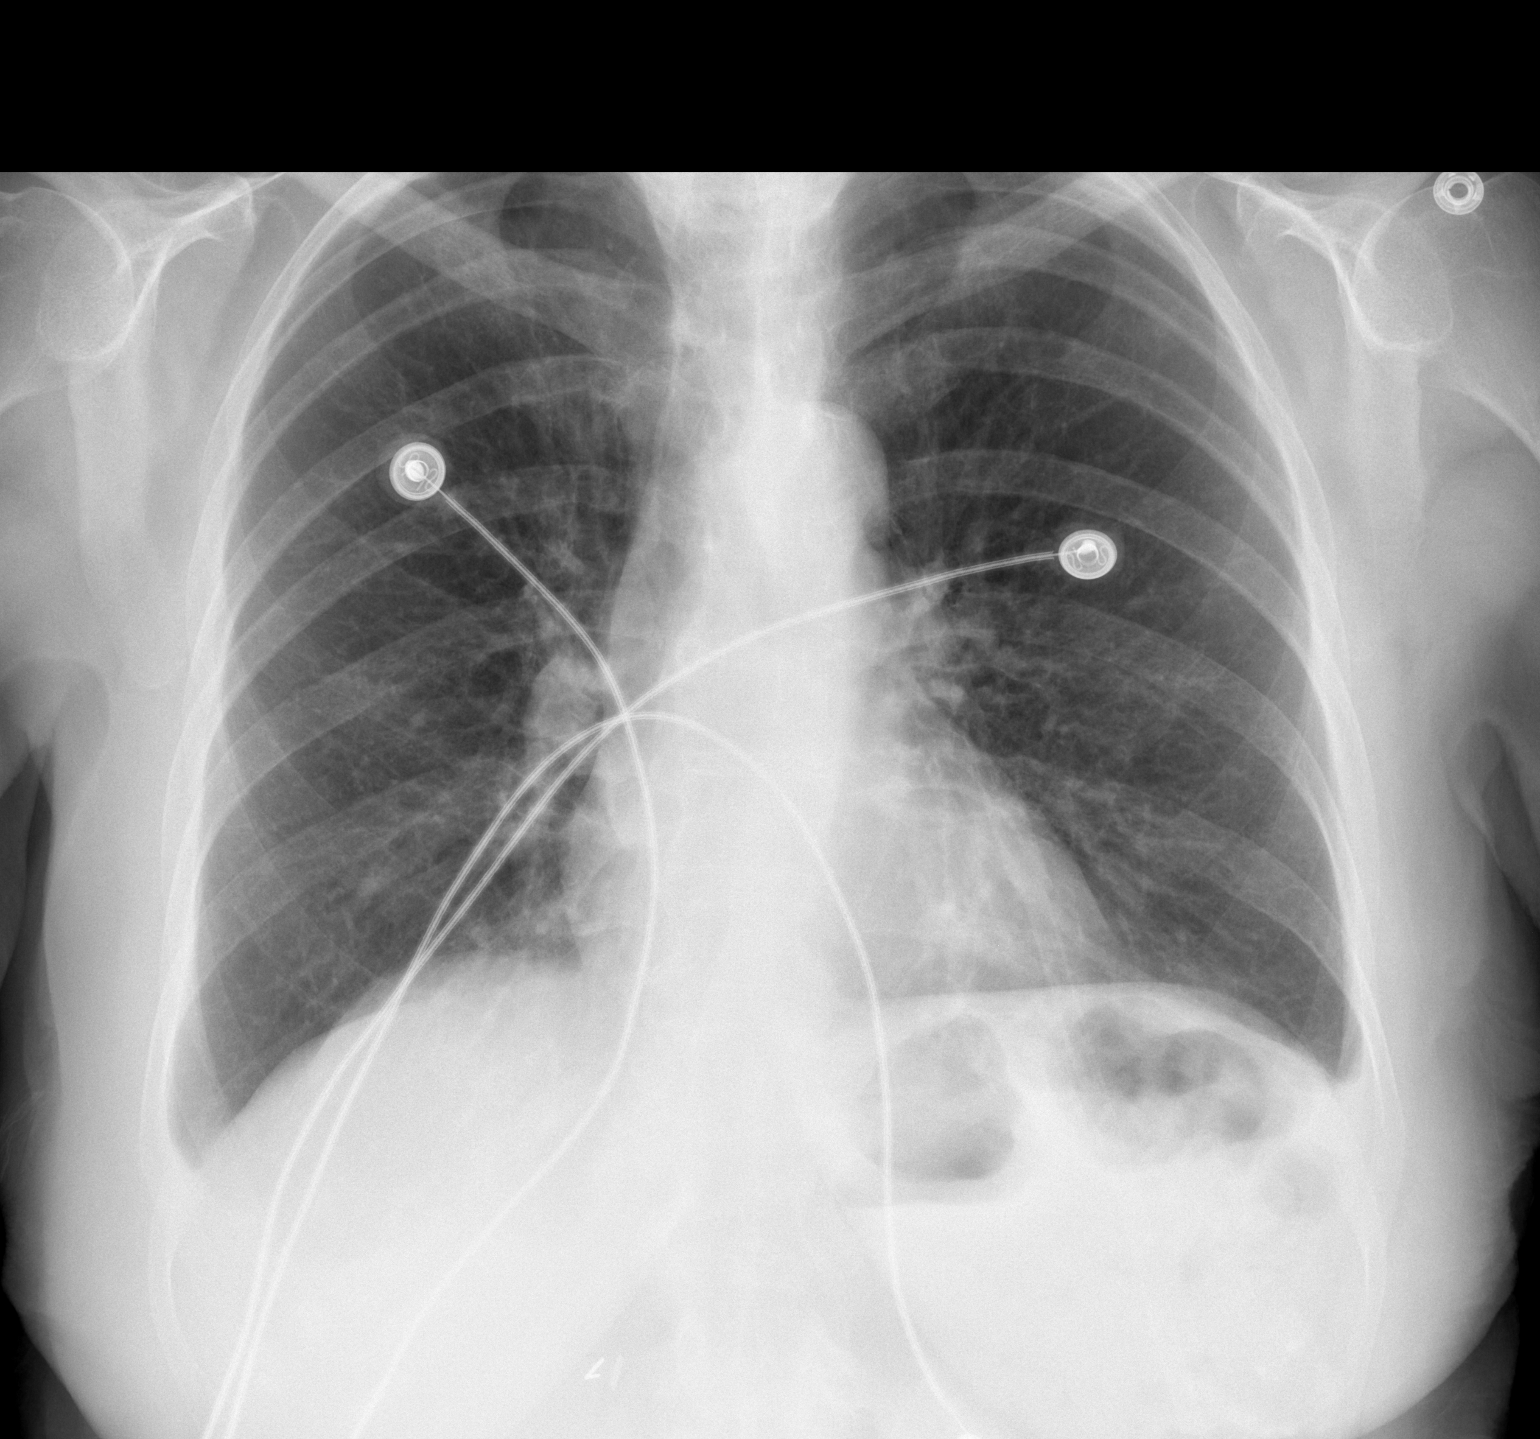

[w chest lat]
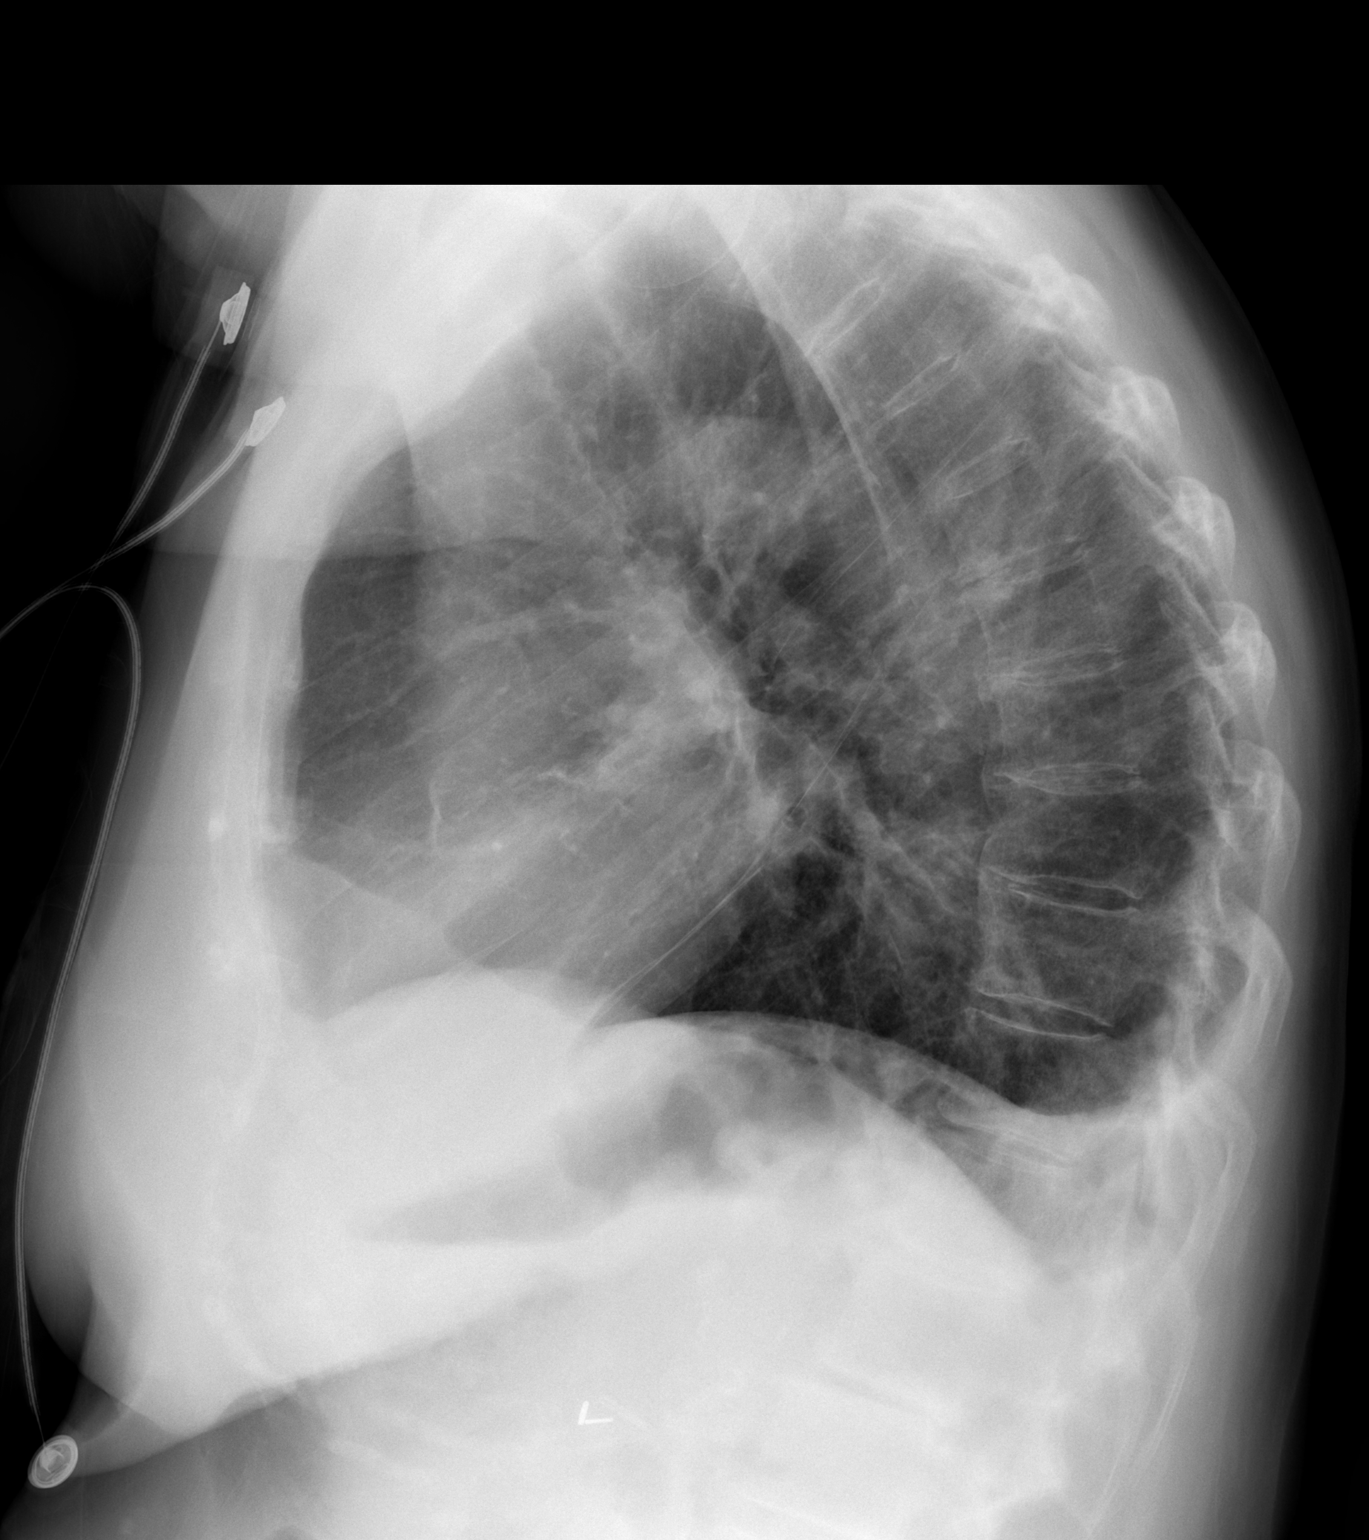

[2 of 2 positions shown; findings below may reference images not displayed]

FINDINGS: Normal sized heart.  Clear lungs.  Small bilateral
pleural effusions.  Thoracic spine degenerative changes and mild
scoliosis.  Cholecystectomy clips.
IMPRESSION: Small bilateral pleural effusions.

## 2013-01-16 IMAGING — CR DG CHEST 1V PORT
1 series · 1 of 1 positions shown · non-contrast
Comparison: 04/24/2010 and earlier.

CLINICAL DATA: 87-year-old female status post CABG.

PORTABLE CHEST - 1 VIEW

[AP]
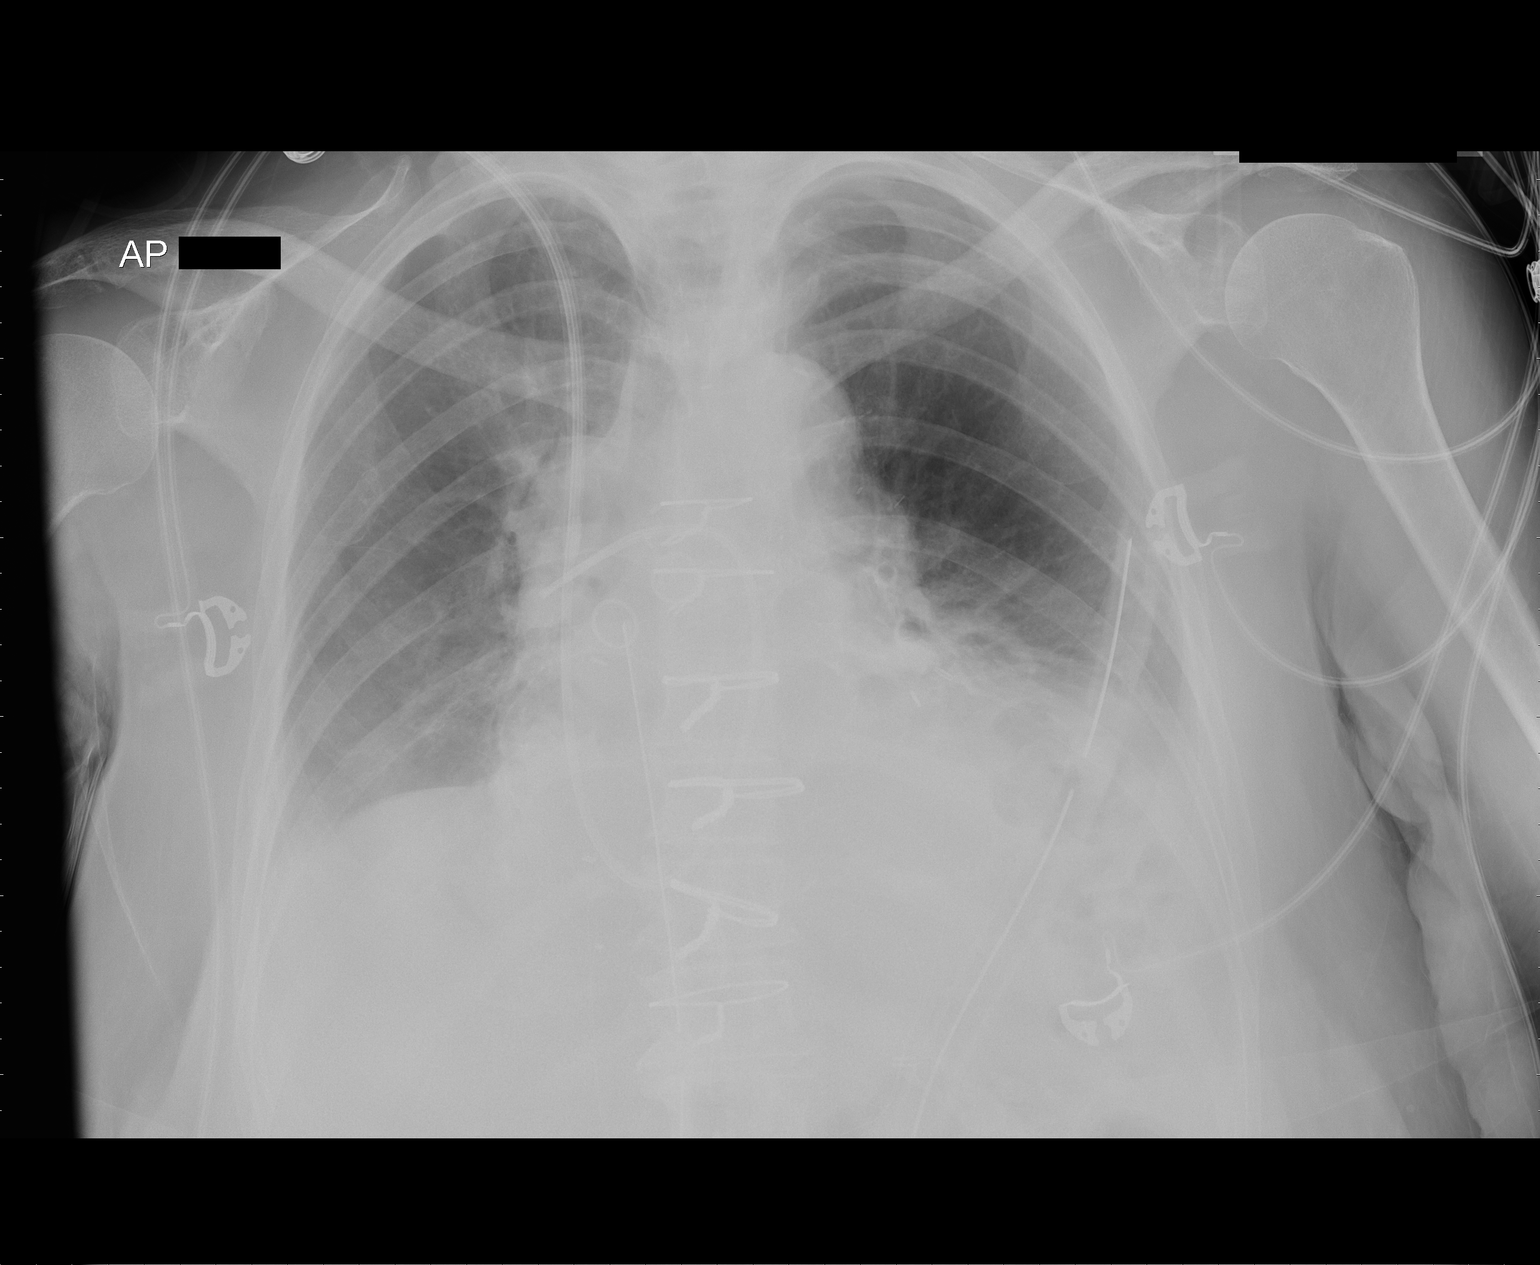

[1 of 1 positions shown; findings below may reference images not displayed]

FINDINGS: Semi upright AP portable view 7587 hours.  The patient is
extubated.  Enteric tube removed.  Left chest tube and mediastinal
drain remain in place.  Right IJ Swan-Ganz catheter remains in
place, tip at the level of the mid right lower lobe pulmonary
artery.  Lower lung volumes.  Stable cardiac size and mediastinal
contours.  No pneumothorax or pulmonary edema.  Increased bibasilar
atelectasis.
IMPRESSION: 1.  Extubated.  Enteric tube removed.  Other lines and tubes remain
in place as above.
2.  Lower lung volumes and increased bibasilar atelectasis.  No
pneumothorax.

## 2013-01-17 IMAGING — CR DG CHEST 1V PORT
1 series · 1 of 1 positions shown · non-contrast
Comparison: 04/25/2010

CLINICAL DATA: Status post CABG

PORTABLE CHEST - 1 VIEW

[view not recorded]
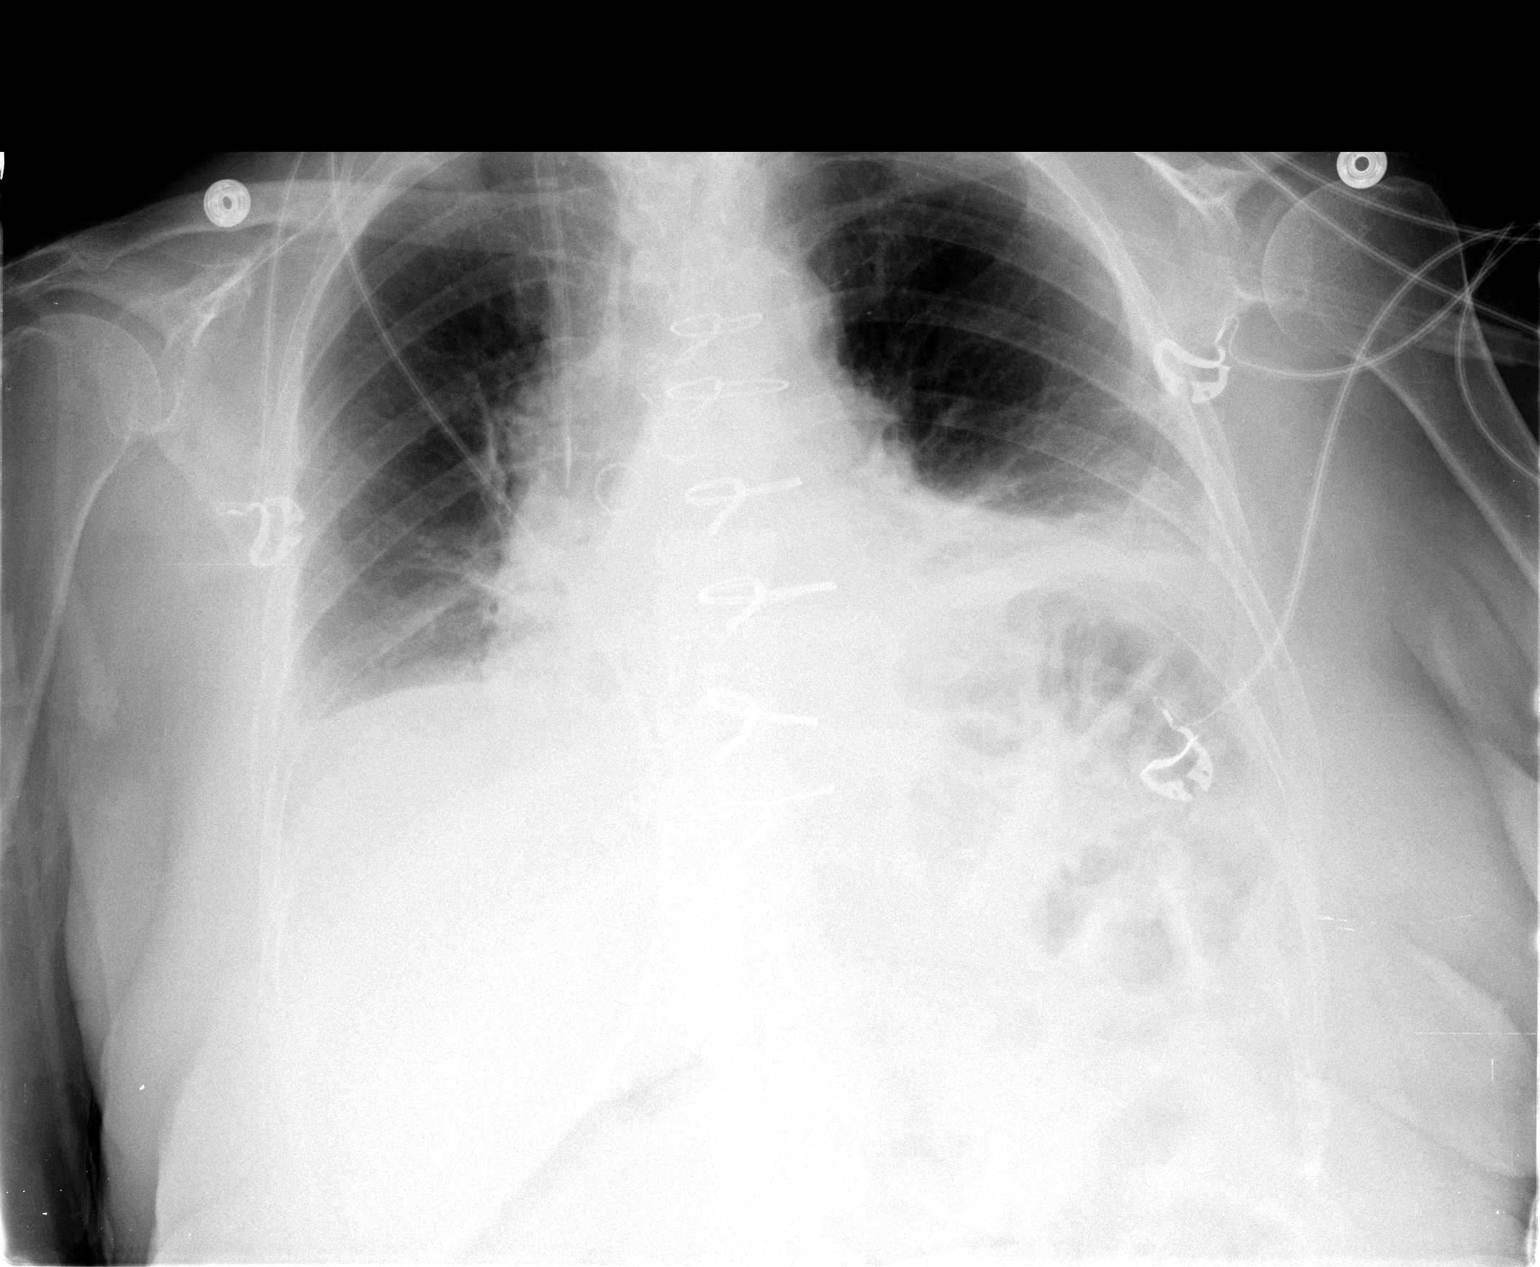

[1 of 1 positions shown; findings below may reference images not displayed]

FINDINGS: There is a right IJ catheter with tip in the projection
of the SVC.Interval removal of Swan-Ganz catheter.  The mediastinal
drain and left-sided chest tube is also been removed.  No
pneumothorax noted.

Heart size appears normal.  The patient is status post median
sternotomy and CABG procedure.

The lung volumes are low.

There is atelectasis within both lung bases left greater than
right.
IMPRESSION: 1.  Persistent low lung volumes with bibasilar atelectasis.

## 2013-01-29 ENCOUNTER — Ambulatory Visit: Payer: Self-pay | Admitting: Hematology and Oncology

## 2013-02-07 ENCOUNTER — Other Ambulatory Visit: Payer: Self-pay | Admitting: Family Medicine

## 2013-02-23 LAB — CBC CANCER CENTER
Basophil #: 0 x10 3/mm (ref 0.0–0.1)
Basophil %: 0.7 %
Eosinophil #: 0.2 x10 3/mm (ref 0.0–0.7)
HCT: 34.2 % — ABNORMAL LOW (ref 35.0–47.0)
HGB: 11.2 g/dL — ABNORMAL LOW (ref 12.0–16.0)
Lymphocyte #: 1.4 x10 3/mm (ref 1.0–3.6)
Lymphocyte %: 25 %
MCH: 29.8 pg (ref 26.0–34.0)
MCHC: 32.6 g/dL (ref 32.0–36.0)
Monocyte #: 0.6 x10 3/mm (ref 0.2–0.9)
Monocyte %: 10.4 %
Neutrophil #: 3.5 x10 3/mm (ref 1.4–6.5)
Platelet: 196 x10 3/mm (ref 150–440)

## 2013-02-23 LAB — COMPREHENSIVE METABOLIC PANEL
Albumin: 2.5 g/dL — ABNORMAL LOW (ref 3.4–5.0)
Alkaline Phosphatase: 90 U/L
Bilirubin,Total: 0.6 mg/dL (ref 0.2–1.0)
Calcium, Total: 8.8 mg/dL (ref 8.5–10.1)
Co2: 33 mmol/L — ABNORMAL HIGH (ref 21–32)
Creatinine: 0.92 mg/dL (ref 0.60–1.30)
EGFR (African American): >60
Glucose: 107 mg/dL — ABNORMAL HIGH (ref 65–99)
Potassium: 3.6 mmol/L (ref 3.5–5.1)
SGOT(AST): 18 U/L (ref 15–37)
SGPT (ALT): 13 U/L (ref 12–78)
Sodium: 136 mmol/L (ref 136–145)
Total Protein: 5.8 g/dL — ABNORMAL LOW (ref 6.4–8.2)

## 2013-02-28 ENCOUNTER — Ambulatory Visit: Payer: Self-pay | Admitting: Hematology and Oncology

## 2013-03-02 ENCOUNTER — Ambulatory Visit: Payer: Self-pay | Admitting: Hematology and Oncology

## 2013-03-17 LAB — COMPREHENSIVE METABOLIC PANEL
Albumin: 2.5 g/dL — ABNORMAL LOW (ref 3.4–5.0)
Alkaline Phosphatase: 86 U/L
Bilirubin,Total: 0.4 mg/dL (ref 0.2–1.0)
Calcium, Total: 8.4 mg/dL — ABNORMAL LOW (ref 8.5–10.1)
Co2: 32 mmol/L (ref 21–32)
EGFR (Non-African Amer.): 49 — ABNORMAL LOW
Glucose: 95 mg/dL (ref 65–99)
Osmolality: 275 (ref 275–301)
SGOT(AST): 23 U/L (ref 15–37)
SGPT (ALT): 16 U/L (ref 12–78)
Sodium: 137 mmol/L (ref 136–145)
Total Protein: 5.8 g/dL — ABNORMAL LOW (ref 6.4–8.2)

## 2013-03-17 LAB — CBC CANCER CENTER
Basophil %: 0.7 %
Eosinophil #: 0.2 x10 3/mm (ref 0.0–0.7)
HGB: 11 g/dL — ABNORMAL LOW (ref 12.0–16.0)
Lymphocyte %: 30.8 %
MCH: 29.3 pg (ref 26.0–34.0)
Monocyte %: 9 %
Platelet: 205 x10 3/mm (ref 150–440)
RDW: 14.8 % — ABNORMAL HIGH (ref 11.5–14.5)
WBC: 6.3 x10 3/mm (ref 3.6–11.0)

## 2013-03-31 ENCOUNTER — Ambulatory Visit: Payer: Self-pay | Admitting: Hematology and Oncology

## 2013-04-14 ENCOUNTER — Emergency Department: Payer: Self-pay | Admitting: Emergency Medicine

## 2013-04-14 LAB — URINALYSIS, COMPLETE
Bacteria: NONE SEEN
Bilirubin,UR: NEGATIVE
Glucose,UR: NEGATIVE mg/dL (ref 0–75)
KETONE: NEGATIVE
NITRITE: NEGATIVE
Ph: 6 (ref 4.5–8.0)
RBC,UR: 5 /HPF (ref 0–5)
Specific Gravity: 1.026 (ref 1.003–1.030)
WBC UR: 15 /HPF (ref 0–5)

## 2013-04-14 LAB — COMPREHENSIVE METABOLIC PANEL
ALK PHOS: 107 U/L
Albumin: 3 g/dL — ABNORMAL LOW (ref 3.4–5.0)
Anion Gap: 4 — ABNORMAL LOW (ref 7–16)
BUN: 21 mg/dL — ABNORMAL HIGH (ref 7–18)
Bilirubin,Total: 1 mg/dL (ref 0.2–1.0)
CALCIUM: 9.2 mg/dL (ref 8.5–10.1)
CHLORIDE: 99 mmol/L (ref 98–107)
CREATININE: 1.16 mg/dL (ref 0.60–1.30)
Co2: 30 mmol/L (ref 21–32)
GFR CALC AF AMER: 49 — AB
GFR CALC NON AF AMER: 42 — AB
GLUCOSE: 149 mg/dL — AB (ref 65–99)
OSMOLALITY: 272 (ref 275–301)
POTASSIUM: 3.8 mmol/L (ref 3.5–5.1)
SGOT(AST): 51 U/L — ABNORMAL HIGH (ref 15–37)
SGPT (ALT): 29 U/L (ref 12–78)
SODIUM: 133 mmol/L — AB (ref 136–145)
Total Protein: 6.9 g/dL (ref 6.4–8.2)

## 2013-04-14 LAB — LIPASE, BLOOD: LIPASE: 64 U/L — AB (ref 73–393)

## 2013-04-14 LAB — CBC
HCT: 39.8 % (ref 35.0–47.0)
HGB: 13.2 g/dL (ref 12.0–16.0)
MCH: 29.4 pg (ref 26.0–34.0)
MCHC: 33.1 g/dL (ref 32.0–36.0)
MCV: 89 fL (ref 80–100)
Platelet: 212 10*3/uL (ref 150–440)
RBC: 4.47 10*6/uL (ref 3.80–5.20)
RDW: 15 % — ABNORMAL HIGH (ref 11.5–14.5)
WBC: 7.6 10*3/uL (ref 3.6–11.0)

## 2013-06-24 ENCOUNTER — Ambulatory Visit (INDEPENDENT_AMBULATORY_CARE_PROVIDER_SITE_OTHER): Payer: Medicare PPO | Admitting: Family Medicine

## 2013-06-24 ENCOUNTER — Encounter: Payer: Self-pay | Admitting: Family Medicine

## 2013-06-24 VITALS — BP 134/62 | HR 75 | Temp 98.0°F | Wt 115.5 lb

## 2013-06-24 DIAGNOSIS — E7800 Pure hypercholesterolemia, unspecified: Secondary | ICD-10-CM

## 2013-06-24 DIAGNOSIS — E039 Hypothyroidism, unspecified: Secondary | ICD-10-CM

## 2013-06-24 DIAGNOSIS — Z7189 Other specified counseling: Secondary | ICD-10-CM

## 2013-06-24 DIAGNOSIS — C20 Malignant neoplasm of rectum: Secondary | ICD-10-CM

## 2013-06-24 DIAGNOSIS — I10 Essential (primary) hypertension: Secondary | ICD-10-CM

## 2013-06-24 LAB — COMPREHENSIVE METABOLIC PANEL
ALT: 11 U/L (ref 0–35)
AST: 17 U/L (ref 0–37)
Albumin: 3.2 g/dL — ABNORMAL LOW (ref 3.5–5.2)
Alkaline Phosphatase: 90 U/L (ref 39–117)
BUN: 15 mg/dL (ref 6–23)
CO2: 30 meq/L (ref 19–32)
CREATININE: 0.9 mg/dL (ref 0.4–1.2)
Calcium: 9 mg/dL (ref 8.4–10.5)
Chloride: 99 mEq/L (ref 96–112)
GFR: 63.58 mL/min (ref >60.00)
Glucose, Bld: 106 mg/dL — ABNORMAL HIGH (ref 70–99)
Potassium: 4 mEq/L (ref 3.5–5.1)
Sodium: 135 mEq/L (ref 135–145)
Total Bilirubin: 0.7 mg/dL (ref 0.3–1.2)
Total Protein: 6.3 g/dL (ref 6.0–8.3)

## 2013-06-24 LAB — LIPID PANEL
CHOLESTEROL: 122 mg/dL (ref 0–200)
HDL: 46.8 mg/dL (ref >39.00)
LDL Cholesterol: 55 mg/dL (ref 0–99)
TRIGLYCERIDES: 101 mg/dL (ref 0.0–149.0)
Total CHOL/HDL Ratio: 3
VLDL: 20.2 mg/dL (ref 0.0–40.0)

## 2013-06-24 LAB — TSH: TSH: 2.29 u[IU]/mL (ref 0.35–5.50)

## 2013-06-24 MED ORDER — CARVEDILOL 6.25 MG PO TABS: ORAL_TABLET | ORAL | Status: DC

## 2013-06-24 MED ORDER — SIMVASTATIN 40 MG PO TABS: ORAL_TABLET | ORAL | Status: AC

## 2013-06-24 MED ORDER — LEVOTHYROXINE SODIUM 25 MCG PO TABS: 25.0000 ug | ORAL_TABLET | Freq: Every day | ORAL | Status: AC

## 2013-06-24 NOTE — Assessment & Plan Note (Addendum)
Continue as is for now. See notes on labs.  Labs are nonfasting.

## 2013-06-24 NOTE — Assessment & Plan Note (Signed)
Rectal cancer s/p chemo and rady, ostomy now. No plans for more chemo given her status. "they said I'd live longer if I didn't have any chemo." No bleeding. Fair PO intake. Living alone, no fevers, no pain. Hernia at the ostomy, no plans for correction given her other issue.

## 2013-06-24 NOTE — Assessment & Plan Note (Signed)
See notes on labs. 

## 2013-06-24 NOTE — Progress Notes (Signed)
Pre visit review using our clinic review tool, if applicable. No additional management support is needed unless otherwise documented below in the visit note.  Rectal cancer s/p chemo and rady, ostomy now.  No plans for more chemo given her status. "they said I'd live longer if I didn't have any chemo."  No bleeding.  Fair PO intake.  Living alone, no fevers, no pain.  Hernia at the ostomy, no plans for correction given her other issue.   Advance directive d/w pt.  Would have her son Markus Jarvis if she were incapacitated.   Hypertension:    Using medication without problems or lightheadedness: yes Chest pain with exertion:no Edema:no Short of breath:no  Elevated Cholesterol: Using medications without problems:yes Muscle aches: no Diet compliance:yes Exercise:limited  Hypothyroid, compliant with meds.  No ADE.  Due for labs.    PMH and SH reviewed  ROS: See HPI, otherwise noncontributory.  Meds, vitals, and allergies reviewed.   nad Thin appearing ncat Mmm rrr ctab Abd soft, no ttp, hernia at the ostomy noted.   Ext w/o edema No TMG on exam.

## 2013-06-24 NOTE — Patient Instructions (Signed)
Go to the lab on the way out.  We'll contact you with your lab report. Take care.  Glad to see you.  Call if you have questions or if I can be of service.

## 2013-06-24 NOTE — Assessment & Plan Note (Signed)
Continue current meds.  See notes on labs.

## 2013-06-25 ENCOUNTER — Telehealth: Payer: Self-pay | Admitting: Family Medicine

## 2013-06-25 NOTE — Telephone Encounter (Signed)
Relevant patient education mailed to patient.  

## 2013-08-24 ENCOUNTER — Encounter: Payer: Self-pay | Admitting: Internal Medicine

## 2013-08-24 ENCOUNTER — Ambulatory Visit (INDEPENDENT_AMBULATORY_CARE_PROVIDER_SITE_OTHER): Payer: Medicare PPO | Admitting: Internal Medicine

## 2013-08-24 VITALS — BP 100/58 | HR 77 | Temp 98.0°F | Wt 111.0 lb

## 2013-08-24 DIAGNOSIS — F419 Anxiety disorder, unspecified: Secondary | ICD-10-CM | POA: Insufficient documentation

## 2013-08-24 DIAGNOSIS — G4700 Insomnia, unspecified: Secondary | ICD-10-CM | POA: Insufficient documentation

## 2013-08-24 DIAGNOSIS — N318 Other neuromuscular dysfunction of bladder: Secondary | ICD-10-CM

## 2013-08-24 DIAGNOSIS — R350 Frequency of micturition: Secondary | ICD-10-CM | POA: Insufficient documentation

## 2013-08-24 DIAGNOSIS — N3281 Overactive bladder: Secondary | ICD-10-CM

## 2013-08-24 DIAGNOSIS — F411 Generalized anxiety disorder: Secondary | ICD-10-CM

## 2013-08-24 LAB — POCT URINALYSIS DIPSTICK
Glucose, UA: NEGATIVE
Ketones, UA: NEGATIVE
NITRITE UA: NEGATIVE
Spec Grav, UA: 1.015
UROBILINOGEN UA: 0.2
pH, UA: 6.5

## 2013-08-24 MED ORDER — OXYBUTYNIN 3.9 MG/24HR TD PTTW: 1.0000 | MEDICATED_PATCH | TRANSDERMAL | Status: DC

## 2013-08-24 MED ORDER — MIRTAZAPINE 15 MG PO TABS: 15.0000 mg | ORAL_TABLET | Freq: Every day | ORAL | Status: DC

## 2013-08-24 NOTE — Assessment & Plan Note (Signed)
Will start remeron  RTC in 1 month to follow up with PCP

## 2013-08-24 NOTE — Progress Notes (Signed)
Pre visit review using our clinic review tool, if applicable. No additional management support is needed unless otherwise documented below in the visit note. 

## 2013-08-24 NOTE — Progress Notes (Signed)
Subjective:    Patient ID: Betty Dunn, female    DOB: [birth year], 80 y.o.   MRN: [id number]  HPI  Pt presents to the clinic today with multiple complaints:   1- She c/o urgency and frequency. She reports this started 1 year ago. She gets up multiple times per night to use the bathroom. She has not seen any blood in her urine. She denies fever, chills, nausea or low back pain.  2- She c/o anxiety. This has been a chronic issue since her heart attack and last heart operation. She just feels nervous that something is going to happen to her. She reports she has not been treated recently for her anxiety.  3- She c/o insomnia. She reports that she wakes up every hour it seems like. This is partly due to the fact that she gets up every 1 hour to use the restroom. She has not tried anything OTC for this.  Review of Systems      Past Medical History  Diagnosis Date  . Anxiety   . Hypertension   . Urinary incontinence   . Coronary artery disease     s/p MI, CABG 03/2010  . Osteoarthritis     knee s/p injection by ortho 03/2010  . Pneumonia 05/2010    [facility] admission  . CHF (congestive heart failure) 05/2010    Exacerbation - [facility] admission  . Rectal cancer     Current Outpatient Prescriptions  Medication Sig Dispense Refill  . carvedilol (COREG) 6.25 MG tablet TAKE 1 TABLET TWICE DAILY WITH MEALS  180 tablet  3  . levothyroxine (SYNTHROID, LEVOTHROID) 25 MCG tablet Take 1 tablet (25 mcg total) by mouth daily before breakfast.  90 tablet  3  . simvastatin (ZOCOR) 40 MG tablet TAKE 1 TABLET AT BEDTIME  90 tablet  3   No current facility-administered medications for this visit.    Allergies  Allergen Reactions  . Amlodipine Besylate     REACTION: itching  . Ciprofloxacin     REACTION: nausea and vomiting  . Penicillins     REACTION: rash  . Sulfamethoxazole-Trimethoprim     REACTION: nausea and viomiting  . Sulfonamide Derivatives     REACTION: Rash and itching     Family History  Problem Relation Age of Onset  . Stroke Father   . Other Mother     old age  . Heart attack Sister   . Stroke Sister     Massive  . Cancer Brother   . Other Brother     CHF  . Heart disease Brother     CHF  . Diabetes Son   . Heart disease Son     Pacemaker    History   Social History  . Marital Status: Widowed    Spouse Name: N/A    Number of Children: 1  . Years of Education: N/A   Occupational History  . Retired at age 29, prev. [employer]    Social History Main Topics  . Smoking status: Never Smoker   . Smokeless tobacco: Never Used  . Alcohol Use: No  . Drug Use: No  . Sexual Activity: Not on file   Other Topics Concern  . Not on file   Social History Narrative   Marital Status: widowed since 1994   Children: 1 son with DM-pacemaker   Occupation: Retired at age 19, [place]   lives alone, in [place]   no tob  no alcohol     Constitutional: Denies fever, malaise, fatigue, headache or abrupt weight changes. .  Gastrointestinal: Denies abdominal pain, bloating, constipation, diarrhea or blood in the stool.  GU: Pt reports urgency and frequency. Denies pain with urination, burning sensation, blood in urine, odor or discharge. Psych: Pt reports anxiety and insomnia. Denies depression, SI/HI.  No other specific complaints in a complete review of systems (except as listed in HPI above).  Objective:   Physical Exam   BP 100/58  Pulse 77  Temp(Src) 98 F (36.7 C) (Oral)  Wt 111 lb (50.349 kg)  SpO2 98% Wt Readings from Last 3 Encounters:  08/24/13 111 lb (50.349 kg)  06/24/13 115 lb 8 oz (52.39 kg)  06/17/12 132 lb 8 oz (60.102 kg)    General: Appears her stated age, chronically ill appearing in NAD. Abdomen: Soft and nontender. Normal bowel sounds, no bruits noted. No distention or masses noted. Herniated stoma noted in the RLQ.  Liver, spleen and kidneys non palpable. Psychiatric: Mood and affect  normal. Behavior is normal. Judgment and thought content normal.     BMET    Component Value Date/Time   NA 135 06/24/2013 1132   K 4.0 06/24/2013 1132   CL 99 06/24/2013 1132   CO2 30 06/24/2013 1132   GLUCOSE 106* 06/24/2013 1132   BUN 15 06/24/2013 1132   CREATININE 0.9 06/24/2013 1132   CALCIUM 9.0 06/24/2013 1132   GFRNONAA 50* 04/30/2010 0325   GFRAA  Value: >60        The eGFR has been calculated using the MDRD equation. This calculation has not been validated in all clinical situations. eGFR's persistently <60 mL/min signify possible Chronic Kidney Disease. 04/30/2010 0325    Lipid Panel     Component Value Date/Time   CHOL 122 06/24/2013 1132   TRIG 101.0 06/24/2013 1132   HDL 46.80 06/24/2013 1132   CHOLHDL 3 06/24/2013 1132   VLDL 20.2 06/24/2013 1132   LDLCALC 55 06/24/2013 1132    CBC    Component Value Date/Time   WBC 7.0 12/30/2011 1225   RBC 3.69* 12/30/2011 1225   HGB 11.4* 12/30/2011 1225   HCT 35.4* 12/30/2011 1225   PLT 181.0 12/30/2011 1225   MCV 96.0 12/30/2011 1225   MCH 31.5 04/30/2010 0325   MCHC 32.3 12/30/2011 1225   RDW 14.6 12/30/2011 1225   LYMPHSABS 2.0 12/30/2011 1225   MONOABS 0.6 12/30/2011 1225   EOSABS 0.3 12/30/2011 1225   BASOSABS 0.1 12/30/2011 1225    Hgb A1C Lab Results  Component Value Date   HGBA1C  Value: 5.5 (NOTE)                                                                       According to the ADA Clinical Practice Recommendations for 2011, when HbA1c is used as a screening test:   >=6.5%   Diagnostic of Diabetes Mellitus           (if abnormal result  is confirmed)  5.7-6.4%   Increased risk of developing Diabetes Mellitus  References:Diagnosis and Classification of Diabetes Mellitus,Diabetes IEPP,2951,88(CZYSA 1):S62-S69 and Standards of Medical Care in         Diabetes - 2011,Diabetes [id number],6010,93  (Suppl  1):S11-S61. 04/27/2010        Assessment & Plan:   Frequency. Urgency:  Symptoms have been going on 1 year Urinalysis: small  leuks, small blood: ? Asymptomatic bacteruria Will treat for OAB

## 2013-08-24 NOTE — Assessment & Plan Note (Signed)
Will try oxytrol patch

## 2013-08-24 NOTE — Assessment & Plan Note (Signed)
Will start remeron

## 2013-08-24 NOTE — Patient Instructions (Addendum)
Overactive Bladder, Adult The bladder has two functions that are totally opposite of the other. One is to relax and stretch out so it can store urine (fills like a balloon), and the other is to contract and squeeze down so that it can empty the urine that it has stored. Proper functioning of the bladder is a complex mixing of these two functions. The filling and emptying of the bladder can be influenced by:  The bladder.  The spinal cord.  The brain.  The nerves going to the bladder.  Other organs that are closely related to the bladder such as prostate in males and the vagina in females. As your bladder fills with urine, nerve signals are sent from the bladder to the brain to tell you that you may need to urinate. Normal urination requires that the bladder squeeze down with sufficient strength to empty the bladder, but this also requires that the bladder squeeze down sufficiently long to finish the job. In addition the sphincter muscles, which normally keep you from leaking urine, must also relax so that the urine can pass. Coordination between the bladder muscle squeezing down and the sphincter muscles relaxing is required to make everything happen normally. With an overactive bladder sometimes the muscles of the bladder contract unexpectedly and involuntarily and this causes an urgent need to urinate. The normal response is to try to hold urine in by contracting the sphincter muscles. Sometimes the bladder contracts so strongly that the sphincter muscles cannot stop the urine from passing out and incontinence occurs. This kind of incontinence is called urge incontinence. Having an overactive bladder can be embarrassing and awkward. It can keep you from living life the way you want to. Many people think it is just something you have to put up with as you grow older or have certain health conditions. In fact, there are treatments that can help make your life easier and more pleasant. CAUSES  Many  things can cause an overactive bladder. Possibilities include:  Urinary tract infection or infection of nearby tissues such as the prostate.  Prostate enlargement.  In women, multiple pregnancies or surgery on the uterus or urethra.  Bladder stones, inflammation or tumors.  Caffeine.  Alcohol.  Medications. For example, diuretics (drugs that help the body get rid of extra fluid) increase urine production. Some other medicines must be taken with lots of fluids.  Muscle or nerve weakness. This might be the result of a spinal cord injury, a stroke, multiple sclerosis or Parkinson's disease.  Diabetes can cause a high urine volume which fills the bladder so quickly that the normal urge to urinate is triggered very strongly. SYMPTOMS   Loss of bladder control. You feel the need to urinate and cannot make your body wait.  Sudden, strong urges to urinate.  Urinating 8 or more times a day.  Waking up to urinate two or more times a night. DIAGNOSIS  To decide if you have overactive bladder, your healthcare provider will probably:  Ask about symptoms you have noticed.  Ask about your overall health. This will include questions about any medications you are taking.  Do a physical examination. This will help determine if there are obvious blockages or other problems.  Order some tests. These might include:  A blood test to check for diabetes or other health issues that could be contributing to the problem.  Urine testing. This could measure the flow of urine and the pressure on the bladder.  A test of your neurological   system (the brain, spinal cord and nerves). This is the system that senses the need to urinate. Some of these tests are called flow tests, bladder pressure tests and electrical measurements of the sphincter muscle.  A bladder test to check whether it is emptying completely when you urinate.  Cytoscopy. This test uses a thin tube with a tiny camera on it. It offers a  look inside your urethra and bladder to see if there are problems.  Imaging tests. You might be given a contrast dye and then asked to urinate. X-rays are taken to see how your bladder is working. TREATMENT  An overactive bladder can be treated in many ways. The treatment will depend on the cause. Whether you have a mild or severe case also makes a difference. Often, treatment can be given in your healthcare provider's office or clinic. Be sure to discuss the different options with your caregiver. They include:  Behavioral treatments. These do not involve medication or surgery:  Bladder training. For this, you would follow a schedule to urinate at regular intervals. This helps you learn to control the urge to urinate. At first, you might be asked to wait a few minutes after feeling the urge. In time, you should be able to schedule bathroom visits an hour or more apart.  Kegel exercises. These exercises strengthen the pelvic floor muscles, which support the bladder. By toning these muscles, they can help control urination, even if the bladder muscles are overactive. A specialist will teach you how to do these exercises correctly. They will require daily practice.  Weight loss. If you are obese or overweight, losing weight might stop your bladder from being overactive. Talk to your healthcare provider about how many pounds you should lose. Also ask if there is a specific program or method that would work best for you.  Diet change. This might be suggested if constipation is making your overactive bladder worse. Your healthcare provider or a nutritionist can explain ways to change what you eat to ease constipation. Other people might need to take in less caffeine or alcohol. Sometimes drinking fewer fluids is needed, too.  Protection. This is not an actual treatment. But, you could wear special pads to take care of any leakage while you wait for other treatments to take effect. This will help you avoid  embarrassment.  Physical treatments.  Electrical stimulation. Electrodes will send gentle pulses to the nerves or muscles that help control the bladder. The goal is to strengthen them. Sometimes this is done with the electrodes outside of the body. Or, they might be placed inside the body (implanted). This treatment can take several months to have an effect.  Medications. These are usually used along with other treatments. Several medicines are available. Some are injected into the muscles involved in urination. Others come in pill form. Medications sometimes prescribed include:  Anticholinergics. These drugs block the signals that the nerves deliver to the bladder. This keeps it from releasing urine at the wrong time. Researchers think the drugs might help in other ways, too.  Imipramine. This is an antidepressant. But, it relaxes bladder muscles.  Botox. This is still experimental. Some people believe that injecting it into the bladder muscles will relax them so they work more normally. It has also been injected into the sphincter muscle when the sphincter muscle does not open properly. This is a temporary fix, however. Also, it might make matters worse, especially in older people.  Surgery.  A device might be implanted  bladder muscles will relax them so they work more normally. It has also been injected into the sphincter muscle when the sphincter muscle does not open properly. This is a temporary fix, however. Also, it might make matters worse, especially in older people.  · Surgery.  · A device might be implanted to help manage your nerves. It works on the nerves that signal when you need to urinate.  · Surgery is sometimes needed with electrical stimulation. If the electrodes are implanted, this is done through surgery.  · Sometimes repairs need to be made through surgery. For example, the size of the bladder can be changed. This is usually done in severe cases only.  HOME CARE INSTRUCTIONS   · Take any medications your healthcare provider prescribed or suggested. Follow the directions carefully.  · Practice any lifestyle changes that are recommended. These might include:  · Drinking less fluid or drinking at different times of the day. If you need to urinate often during the night, for  example, you may need to stop drinking fluids early in the evening.  · Cutting down on caffeine or alcohol. They can both make an overactive bladder worse. Caffeine is found in coffee, tea and sodas.  · Doing Kegel exercises to strengthen muscles.  · Losing weight, if that is recommended.  · Eating a healthy and balanced diet. This will help you avoid constipation.  · Keep a journal or a log. You might be asked to record how much you drink and when, and also when you feel the need to urinate.  · Learn how to care for implants or other devices, such as pessaries.  SEEK MEDICAL CARE IF:   · Your overactive bladder gets worse.  · You feel increased pain or irritation when you urinate.  · You notice blood in your urine.  · You have questions about any medications or devices that your healthcare provider recommended.  · You notice blood, pus or swelling at the site of any test or treatment procedure.  · You have an oral temperature above 102° F (38.9° C).  SEEK IMMEDIATE MEDICAL CARE IF:   You have an oral temperature above 102° F (38.9° C), not controlled by medicine.  Document Released: 01/11/2009 Document Revised: 06/09/2011 Document Reviewed: 01/11/2009  Exit[employer]® Patient Information ©2014 [employer].

## 2013-09-08 ENCOUNTER — Other Ambulatory Visit: Payer: Self-pay

## 2013-09-08 MED ORDER — OXYBUTYNIN CHLORIDE 5 MG PO TABS: 5.0000 mg | ORAL_TABLET | Freq: Every day | ORAL | Status: DC

## 2013-10-29 ENCOUNTER — Ambulatory Visit: Payer: Self-pay | Admitting: Internal Medicine

## 2013-11-02 ENCOUNTER — Encounter: Payer: Self-pay | Admitting: Family Medicine

## 2013-11-02 ENCOUNTER — Ambulatory Visit (INDEPENDENT_AMBULATORY_CARE_PROVIDER_SITE_OTHER): Payer: Medicare PPO | Admitting: Family Medicine

## 2013-11-02 VITALS — BP 110/60 | HR 84 | Temp 98.7°F | Wt 109.0 lb

## 2013-11-02 DIAGNOSIS — I10 Essential (primary) hypertension: Secondary | ICD-10-CM

## 2013-11-02 DIAGNOSIS — L98491 Non-pressure chronic ulcer of skin of other sites limited to breakdown of skin: Secondary | ICD-10-CM

## 2013-11-02 DIAGNOSIS — L98499 Non-pressure chronic ulcer of skin of other sites with unspecified severity: Secondary | ICD-10-CM

## 2013-11-02 MED ORDER — CARVEDILOL 6.25 MG PO TABS
3.1250 mg | ORAL_TABLET | Freq: Two times a day (BID) | ORAL | Status: AC
Start: 2013-11-02 — End: ?

## 2013-11-02 NOTE — Progress Notes (Signed)
Pre visit review using our clinic review tool, if applicable. No additional management support is needed unless otherwise documented below in the visit note.  "I had a spell".  No fevers, no vomiting, the room didn't spin.  She was having trouble walking, no new weakness but it sounds like her balance was affected, orthostatic sx by hx. No CP, SOB. No BLE edema.  She feels better now.   It didn't happen laying down. No falls.    Spot on her R buttocks, had been sitting a lot recently.  Irritated area locally.  Using neosporin on the area.   Meds, vitals, and allergies reviewed.   ROS: See HPI.  Otherwise, noncontributory.  nad ncat Elderly female, in [place] Neck supple rrr ctab abd soft Ext with trace edema R buttock with medial lesion, ~1cm, small very superficial ulcer.  Appears clean based and not infected.  No undermining.

## 2013-11-02 NOTE — Patient Instructions (Addendum)
Cut the carvedilol in half and see if you feel better.  Use a doughnut pillow and let me know if the spot isn't healing.  Keep using neosporin for now.  Take care.  Glad to see you.

## 2013-11-03 ENCOUNTER — Telehealth: Payer: Self-pay

## 2013-11-03 ENCOUNTER — Ambulatory Visit: Payer: Medicare PPO | Admitting: Family Medicine

## 2013-11-03 ENCOUNTER — Telehealth: Payer: Self-pay | Admitting: Family Medicine

## 2013-11-03 DIAGNOSIS — L98499 Non-pressure chronic ulcer of skin of other sites with unspecified severity: Secondary | ICD-10-CM

## 2013-11-03 NOTE — Telephone Encounter (Signed)
[place] case mgr left v/m; pt has appt to see Dr Damita Dunnings today; pt is deteriorating; less strength, difficulty eating, wound on buttock, and nausea. Deana thinks pt might benefit from hospice referral. Pts son is sick as well.

## 2013-11-03 NOTE — Assessment & Plan Note (Signed)
Was likely orthostatic, cut her BB to 3.125mg  BID and f/u prn.  She agrees.

## 2013-11-03 NOTE — Assessment & Plan Note (Signed)
Off load with doughnut pillow, continue topical neosporin and fu prn. She agrees.

## 2013-11-03 NOTE — Telephone Encounter (Signed)
Patient Information:  Caller Name: Jori Moll  Phone: [phone]  Patient: Betty Dunn  Gender: Female  DOB: June 22, 1922  Age: 80 Years  PCP: Elsie Stain Brigitte Pulse) [facility])  Office Follow Up:  Does the office need to follow up with this patient?: Yes  Instructions For The Office: Please advise and call son back at number above.   Symptoms  Reason For Call & Symptoms: Son calling regarding pt having trouble getting up from stading position, from sitting. Has been an gradual problem but worse since 11/02/13. NO pain or other c/o. Denies any dizziness, numbness or stroke like sx. Pt has a walker and once she is helped up, she is able to move around just fine, slowly. Pt has a finished chemo/radiation therapy w/in the year for cancer. Pt was seen on 11/02/13 but they forgot to mention this. Son is asking if #1. Physical therapy order might benefit the pt, #2. Should she maybe take some sort of nutritional supplement, as pt is not prone to eat or drink much on a regular basis. Please advise and call son back at number above, per his request.  Reviewed Health History In EMR: Yes  Reviewed Medications In EMR: Yes  Reviewed Allergies In EMR: Yes  Reviewed Surgeries / Procedures: Yes  Date of Onset of Symptoms: 11/02/2013  Guideline(s) Used:  Weakness (Generalized) and Fatigue  Disposition Per Guideline:   See Today in Office  Reason For Disposition Reached:   Moderate weakness (i.e., interferes with work, school, normal activities) and persists > 3 days  Advice Given:  N/A  Patient Will Follow Care Advice:  YES, but did not make appt as pt was seen on 11/02/13. Appt declined pending note.

## 2013-11-03 NOTE — Telephone Encounter (Signed)
I'm glad to see her, but I can put in the PT referral w/o the visit.  I was hoping that dec in BB would have helped her overall stamina.  If the want to come in, then keep the appointment.  It is likely not a bad idea to get [facility] PT set up, along with [facility] RN re: lesion on buttock.   Let me know what they think. Thanks.

## 2013-11-03 NOTE — Telephone Encounter (Signed)
Son Betty Dunn) advised.  Betty Dunn says he really doesn't think it will be of any benefit to come to the appointment today so if you will do the referrals, she is going to cancel.

## 2013-11-03 NOTE — Telephone Encounter (Signed)
I would defer this for now.  See following phone note.  Thanks.

## 2013-11-04 NOTE — Telephone Encounter (Signed)
Referral done.  Thanks.

## 2013-11-07 ENCOUNTER — Telehealth: Payer: Self-pay | Admitting: Family Medicine

## 2013-11-07 NOTE — Telephone Encounter (Signed)
Pt's son called and says someone from here called him.  If it was you, he would like for you to call him back. Thank you.

## 2013-11-07 NOTE — Telephone Encounter (Signed)
This may have been Rosaria Ferries.  It wasn't me.  Routed to [facility] as [place].  Thanks.

## 2013-11-10 ENCOUNTER — Emergency Department: Payer: Self-pay | Admitting: Emergency Medicine

## 2013-11-10 LAB — COMPREHENSIVE METABOLIC PANEL
Albumin: 1.9 g/dL — ABNORMAL LOW (ref 3.4–5.0)
Alkaline Phosphatase: 231 U/L — ABNORMAL HIGH
Anion Gap: 10 (ref 7–16)
BUN: 15 mg/dL (ref 7–18)
Bilirubin,Total: 1.1 mg/dL — ABNORMAL HIGH (ref 0.2–1.0)
CREATININE: 0.89 mg/dL (ref 0.60–1.30)
Calcium, Total: 7.9 mg/dL — ABNORMAL LOW (ref 8.5–10.1)
Chloride: 100 mmol/L (ref 98–107)
Co2: 25 mmol/L (ref 21–32)
EGFR (African American): >60
EGFR (Non-African Amer.): 58 — ABNORMAL LOW
Glucose: 86 mg/dL (ref 65–99)
Osmolality: 270 (ref 275–301)
Potassium: 3.5 mmol/L (ref 3.5–5.1)
SGOT(AST): 78 U/L — ABNORMAL HIGH (ref 15–37)
SGPT (ALT): 14 U/L
Sodium: 135 mmol/L — ABNORMAL LOW (ref 136–145)
Total Protein: 5.6 g/dL — ABNORMAL LOW (ref 6.4–8.2)

## 2013-11-10 LAB — CBC WITH DIFFERENTIAL/PLATELET
BASOS ABS: 0.1 10*3/uL (ref 0.0–0.1)
Basophil %: 0.6 %
EOS ABS: 0.1 10*3/uL (ref 0.0–0.7)
EOS PCT: 1.2 %
HCT: 30.2 % — AB (ref 35.0–47.0)
HGB: 9.5 g/dL — AB (ref 12.0–16.0)
LYMPHS PCT: 10.4 %
Lymphocyte #: 1 10*3/uL (ref 1.0–3.6)
MCH: 27.6 pg (ref 26.0–34.0)
MCHC: 31.5 g/dL — AB (ref 32.0–36.0)
MCV: 88 fL (ref 80–100)
MONOS PCT: 9.7 %
Monocyte #: 1 x10 3/mm — ABNORMAL HIGH (ref 0.2–0.9)
NEUTROS PCT: 78.1 %
Neutrophil #: 7.8 10*3/uL — ABNORMAL HIGH (ref 1.4–6.5)
Platelet: 244 10*3/uL (ref 150–440)
RBC: 3.43 10*6/uL — ABNORMAL LOW (ref 3.80–5.20)
RDW: 17.7 % — ABNORMAL HIGH (ref 11.5–14.5)
WBC: 10 10*3/uL (ref 3.6–11.0)

## 2013-11-10 LAB — LIPASE, BLOOD: Lipase: 39 U/L — ABNORMAL LOW (ref 73–393)

## 2013-11-10 LAB — TROPONIN I: Troponin-I: <0.02 ng/mL

## 2013-11-16 ENCOUNTER — Ambulatory Visit (INDEPENDENT_AMBULATORY_CARE_PROVIDER_SITE_OTHER): Payer: Medicare PPO | Admitting: Family Medicine

## 2013-11-16 ENCOUNTER — Encounter: Payer: Self-pay | Admitting: Family Medicine

## 2013-11-16 VITALS — BP 112/62 | HR 82 | Temp 97.9°F

## 2013-11-16 DIAGNOSIS — M546 Pain in thoracic spine: Secondary | ICD-10-CM

## 2013-11-16 DIAGNOSIS — L98491 Non-pressure chronic ulcer of skin of other sites limited to breakdown of skin: Secondary | ICD-10-CM

## 2013-11-16 DIAGNOSIS — L98499 Non-pressure chronic ulcer of skin of other sites with unspecified severity: Secondary | ICD-10-CM

## 2013-11-16 DIAGNOSIS — R5383 Other fatigue: Secondary | ICD-10-CM

## 2013-11-16 DIAGNOSIS — R5381 Other malaise: Secondary | ICD-10-CM

## 2013-11-16 NOTE — Patient Instructions (Signed)
We'll check on having PT come out.   Use a thicker, firmer pillow in the meantime.  You may need to use a heating pad on your upper back during the day.  Please schedule your follow up with the cancer clinic. I wouldn't increase the coreg at this point.  Take care.

## 2013-11-16 NOTE — Progress Notes (Signed)
Pre visit review using our clinic review tool, if applicable. No additional management support is needed unless otherwise documented below in the visit note.  She is tired and feels weak.  We had cut her BB in half, then she had more fatigue, unclear if related.    More pain the last few nights, in her back.  Only happens at night.  Doesn't have the pain now.  It in the upper R back, near the scapula.  Only happens laying down.  No L sided pain, no midline pain.  Very brief, only last a few seconds and then resolves.   Meds, vitals, and allergies reviewed.   ROS: See HPI.  Otherwise, noncontributory.  nad Ncat Elderly female in wheelchair rrr ctab abd soft, hernia at ostomy site at baseline Back w/o rash, not ttp No bruising Prev skin changes on R buttock improved, now flat w/o ulceration and appears to be healing.  Is bandaged.  1+ BLE edema

## 2013-11-17 DIAGNOSIS — R5383 Other fatigue: Secondary | ICD-10-CM

## 2013-11-17 DIAGNOSIS — M549 Dorsalgia, unspecified: Secondary | ICD-10-CM | POA: Insufficient documentation

## 2013-11-17 DIAGNOSIS — R5381 Other malaise: Secondary | ICD-10-CM | POA: Insufficient documentation

## 2013-11-17 DIAGNOSIS — L89309 Pressure ulcer of unspecified buttock, unspecified stage: Secondary | ICD-10-CM

## 2013-11-17 DIAGNOSIS — L8992 Pressure ulcer of unspecified site, stage 2: Secondary | ICD-10-CM

## 2013-11-17 DIAGNOSIS — I10 Essential (primary) hypertension: Secondary | ICD-10-CM

## 2013-11-17 DIAGNOSIS — M6281 Muscle weakness (generalized): Secondary | ICD-10-CM

## 2013-11-17 NOTE — Assessment & Plan Note (Signed)
Sounds to be a muscle spasm.  Her pillow is worn out and she is a side sleeping.  She'll get a thicker/firmer pillow and notify us as needed.  She agrees.

## 2013-11-17 NOTE — Assessment & Plan Note (Signed)
We had cut her BB but now more fatigued.  Wouldn't re-increase the BB.  D/w pt.  Offered to check her CBC today, she wanted to defer for now and f/u with the cancer center.  Son has called about getting f/u appointment in the near future. No active bleeding, will defer for now.

## 2013-11-17 NOTE — Assessment & Plan Note (Signed)
Improving, continue topical tx, bandaging.

## 2013-11-18 ENCOUNTER — Telehealth: Payer: Self-pay | Admitting: *Deleted

## 2013-11-18 ENCOUNTER — Telehealth: Payer: Self-pay

## 2013-11-18 NOTE — Telephone Encounter (Signed)
I need to talk to the patient before making the referral.  I'll call her.

## 2013-11-18 NOTE — Telephone Encounter (Signed)
Deanne advised.  Deanne says she did speak with Betty Dunn about 2 weeks ago and she was agreeable with Hospice and even had her to speak with the son about it.  Deanne asked if Betty. Geraci mentioned her rectal bleeding to Dr. Damita Dunnings.

## 2013-11-18 NOTE — Telephone Encounter (Signed)
I called pt.  She feels some better today.  She had [place] set up with care [place]. They have come out, will see her again on Monday.  Pt didn't want hospice care now.   We agreed that if her condition changes, we can consider hospice then. I'll await input from patient.   Patient thanked me for the call.

## 2013-11-18 NOTE — Telephone Encounter (Signed)
talked with patient. Dressed.  Please give verbal order for continued care, same as the previous site. Thanks.

## 2013-11-18 NOTE — Telephone Encounter (Signed)
As of 1:56 PM today, she didn't want hospice involvement.  I need a list of all concerns so we can more effectively communicate.   No one has mentioned any bleeding.  I asked patient if she had other concerns today.

## 2013-11-18 NOTE — Telephone Encounter (Signed)
Ovidio Kin nurse with [facility] left v/m; pt has new wound to lt buttock; Zigmund Daniel dressed lt buttock wound as she did the other wound. Zigmund Daniel requested cb. Tried to call Zigmund Daniel back but no answer and mailbox is full.

## 2013-11-18 NOTE — Telephone Encounter (Signed)
Betty Dunn from [facility] says she sees Betty Dunn weekly and feels that she desperately needs Hospice care.  She is not able to do PT because she is so weak and her stoma literally comes out when she does any significant moving around.  The cancer center has already told her there is nothing else they can do.  Those were your plans of treatment at the last OV.  The patient lives alone and her only son is very sick himself.  Betty Dunn says the patient is complaining now of another spot of skin break-down.  Please advise.

## 2013-11-20 LAB — BASIC METABOLIC PANEL
ANION GAP: 9 (ref 7–16)
BUN: 18 mg/dL (ref 7–18)
CHLORIDE: 99 mmol/L (ref 98–107)
CO2: 26 mmol/L (ref 21–32)
Calcium, Total: 8 mg/dL — ABNORMAL LOW (ref 8.5–10.1)
Creatinine: 0.95 mg/dL (ref 0.60–1.30)
EGFR (African American): >60
EGFR (Non-African Amer.): 53 — ABNORMAL LOW
GLUCOSE: 139 mg/dL — AB (ref 65–99)
OSMOLALITY: 272 (ref 275–301)
Potassium: 3.1 mmol/L — ABNORMAL LOW (ref 3.5–5.1)
SODIUM: 134 mmol/L — AB (ref 136–145)

## 2013-11-20 LAB — URINALYSIS, COMPLETE
Blood: NEGATIVE
Glucose,UR: NEGATIVE mg/dL (ref 0–75)
Ketone: NEGATIVE
Nitrite: NEGATIVE
Ph: 6 (ref 4.5–8.0)
SPECIFIC GRAVITY: 1.015 (ref 1.003–1.030)

## 2013-11-20 LAB — CBC
HCT: 31.5 % — AB (ref 35.0–47.0)
HGB: 10.2 g/dL — ABNORMAL LOW (ref 12.0–16.0)
MCH: 28.4 pg (ref 26.0–34.0)
MCHC: 32.5 g/dL (ref 32.0–36.0)
MCV: 87 fL (ref 80–100)
PLATELETS: 205 10*3/uL (ref 150–440)
RBC: 3.6 10*6/uL — AB (ref 3.80–5.20)
RDW: 18.8 % — AB (ref 11.5–14.5)
WBC: 11.9 10*3/uL — AB (ref 3.6–11.0)

## 2013-11-20 LAB — PRO B NATRIURETIC PEPTIDE: B-TYPE NATIURETIC PEPTID: 7787 pg/mL — AB (ref 0–450)

## 2013-11-20 LAB — TROPONIN I: Troponin-I: <0.02 ng/mL

## 2013-11-21 ENCOUNTER — Observation Stay: Payer: Self-pay | Admitting: Internal Medicine

## 2013-11-21 LAB — HEPATIC FUNCTION PANEL A (ARMC)
ALT: 18 U/L
Albumin: 2 g/dL — ABNORMAL LOW (ref 3.4–5.0)
Alkaline Phosphatase: 242 U/L — ABNORMAL HIGH
Bilirubin, Direct: 0.8 mg/dL — ABNORMAL HIGH (ref 0.00–0.20)
Bilirubin,Total: 1.6 mg/dL — ABNORMAL HIGH (ref 0.2–1.0)
SGOT(AST): 102 U/L — ABNORMAL HIGH (ref 15–37)
TOTAL PROTEIN: 5.5 g/dL — AB (ref 6.4–8.2)

## 2013-11-21 LAB — TROPONIN I

## 2013-11-21 LAB — CK-MB
CK-MB: 1.6 ng/mL (ref 0.5–3.6)
CK-MB: 1.5 ng/mL (ref 0.5–3.6)

## 2013-11-22 LAB — BASIC METABOLIC PANEL
ANION GAP: 9 (ref 7–16)
BUN: 11 mg/dL (ref 7–18)
Calcium, Total: 7.8 mg/dL — ABNORMAL LOW (ref 8.5–10.1)
Chloride: 103 mmol/L (ref 98–107)
Co2: 25 mmol/L (ref 21–32)
Creatinine: 0.78 mg/dL (ref 0.60–1.30)
EGFR (African American): >60
Glucose: 67 mg/dL (ref 65–99)
Osmolality: 271 (ref 275–301)
POTASSIUM: 3.3 mmol/L — AB (ref 3.5–5.1)
Sodium: 137 mmol/L (ref 136–145)

## 2013-11-22 LAB — CBC WITH DIFFERENTIAL/PLATELET
Basophil #: 0 10*3/uL (ref 0.0–0.1)
Basophil %: 0.5 %
Eosinophil #: 0.2 10*3/uL (ref 0.0–0.7)
Eosinophil %: 2.2 %
HCT: 28.7 % — AB (ref 35.0–47.0)
HGB: 9.3 g/dL — AB (ref 12.0–16.0)
Lymphocyte #: 1.1 10*3/uL (ref 1.0–3.6)
Lymphocyte %: 10.7 %
MCH: 28.6 pg (ref 26.0–34.0)
MCHC: 32.4 g/dL (ref 32.0–36.0)
MCV: 88 fL (ref 80–100)
Monocyte #: 1 x10 3/mm — ABNORMAL HIGH (ref 0.2–0.9)
Monocyte %: 10 %
NEUTROS PCT: 76.6 %
Neutrophil #: 7.6 10*3/uL — ABNORMAL HIGH (ref 1.4–6.5)
Platelet: 193 10*3/uL (ref 150–440)
RBC: 3.26 10*6/uL — ABNORMAL LOW (ref 3.80–5.20)
RDW: 18.8 % — AB (ref 11.5–14.5)
WBC: 9.9 10*3/uL (ref 3.6–11.0)

## 2013-11-22 LAB — URINE CULTURE

## 2013-11-22 LAB — MAGNESIUM: MAGNESIUM: 1.4 mg/dL — AB

## 2013-11-23 ENCOUNTER — Encounter: Payer: Self-pay | Admitting: Internal Medicine

## 2013-11-23 NOTE — Telephone Encounter (Signed)
Tried to reach [facility] and not able to leave a voicemail because mail box is full, paged [employer].

## 2013-11-23 NOTE — Telephone Encounter (Signed)
Called Sherry at [facility] and asked that she have Zigmund Daniel call the office.

## 2013-11-24 ENCOUNTER — Ambulatory Visit: Payer: Medicare PPO | Admitting: Family Medicine

## 2013-11-24 ENCOUNTER — Encounter: Payer: Self-pay | Admitting: Family Medicine

## 2013-11-24 ENCOUNTER — Telehealth: Payer: Self-pay | Admitting: Family Medicine

## 2013-11-24 LAB — URINALYSIS, COMPLETE
Bilirubin,UR: NEGATIVE
Blood: NEGATIVE
GLUCOSE, UR: NEGATIVE mg/dL (ref 0–75)
Ketone: NEGATIVE
LEUKOCYTE ESTERASE: NEGATIVE
Nitrite: NEGATIVE
PH: 6 (ref 4.5–8.0)
PROTEIN: NEGATIVE
RBC,UR: <1 /HPF (ref 0–5)
Specific Gravity: 1.01 (ref 1.003–1.030)

## 2013-11-24 NOTE — Telephone Encounter (Signed)
Called pt and her son.  Notes from hospital reviewed, progression of metastatic cancer noted.  I didn't realize the extent of her disease burden until I got that report.  When I talked with patient prev, I was not keeping information from her. She and son both understood.  She is in SNF now.  Will notify me if I can be of service.  App help of all involved.

## 2013-11-24 NOTE — Telephone Encounter (Signed)
Left detailed message on voicemail of Betty Dunn.

## 2013-11-25 LAB — URINE CULTURE

## 2013-11-29 ENCOUNTER — Ambulatory Visit: Payer: Self-pay | Admitting: Internal Medicine

## 2013-11-29 ENCOUNTER — Encounter: Payer: Self-pay | Admitting: Internal Medicine

## 2013-12-29 DEATH — deceased

## 2014-07-21 NOTE — Consult Note (Signed)
Chief Complaint:  Subjective/Chief Complaint Pt feels well.  Scant rectal bleeding over past 24 hrs.  Hgb stable.  Denies N/V or abdominal pain.   VITAL SIGNS/ANCILLARY NOTES: **Vital Signs.:   08-May-14 03:51  Vital Signs Type Routine  Temperature Temperature (F) 97.7  Celsius 36.5  Temperature Source oral  Pulse Pulse 74  Respirations Respirations 20  Systolic BP Systolic BP 932  Diastolic BP (mmHg) Diastolic BP (mmHg) 53  Mean BP 73  Pulse Ox % Pulse Ox % 96  Pulse Ox Activity Level  At rest  Oxygen Delivery Room Air/ 21 %    08:56  Vital Signs Type Q 4hr  Pulse Pulse 76  Respirations Respirations 20  Systolic BP Systolic BP 355  Diastolic BP (mmHg) Diastolic BP (mmHg) 66  Mean BP 81  Pulse Ox % Pulse Ox % 96  Pulse Ox Activity Level  At rest  Oxygen Delivery Room Air/ 21 %   Brief Assessment:  Cardiac Regular  no murmur   Respiratory normal resp effort   Gastrointestinal Normal   Gastrointestinal details normal Soft  Nontender  Nondistended  Bowel sounds normal  No rebound tenderness  No gaurding   Additional Physical Exam Gen: A/Ox3. NAD Ext: No LEE.  Multiple ecchymosis bilat UE.   Lab Results: Routine Chem:  07-May-14 06:36   Glucose, Serum 96  BUN 15  Creatinine (comp) 0.99  Sodium, Serum 139  Potassium, Serum 3.5  Chloride, Serum 102  CO2, Serum 30  Calcium (Total), Serum 9.0  Anion Gap 7  Osmolality (calc) 278  eGFR (African American)  59  eGFR (Non-African American)  51 (eGFR values <8mL/min/1.73 m2 may be an indication of chronic kidney disease (CKD). Calculated eGFR is useful in patients with stable renal function. The eGFR calculation will not be reliable in acutely ill patients when serum creatinine is changing rapidly. It is not useful in  patients on dialysis. The eGFR calculation may not be applicable to patients at the low and high extremes of body sizes, pregnant women, and vegetarians.)  Routine Hem:  07-May-14 06:36   WBC (CBC)  9.9  RBC (CBC) 4.03  Hemoglobin (CBC) 12.3  Hematocrit (CBC) 36.5  Platelet Count (CBC) 226  MCV 91  MCH 30.6  MCHC 33.8  RDW 14.0  Neutrophil % 62.4  Lymphocyte % 27.5  Monocyte % 7.3  Eosinophil % 1.9  Basophil % 0.9  Neutrophil # 6.2  Lymphocyte # 2.7  Monocyte # 0.7  Eosinophil # 0.2  Basophil # 0.1 (Result(s) reported on 04 Aug 2012 at 07:06AM.)   Assessment/Plan:  Assessment/Plan:  Assessment 1. Partially obstructing distal sigmoid colon mass: Awaiting pathology.  Plans per surgery & oncology. 2. Scant rectal bleeding secondary to #1:  Hgb stable at this time.   Plan We appreciate Surgery's input Will await Oncology's recommendations Follow up on Final Pathology  Continue Supportive measures Will sign off, please call if any questions or concerns Thanks for allowing [place] to participate in the care of Betty Dunn   Electronic Signatures: Andria Meuse (NP)  (Signed [phone] 09:41)  Authored: Chief Complaint, VITAL SIGNS/ANCILLARY NOTES, Brief Assessment, Lab Results, Assessment/Plan   Last Updated: 08-May-14 09:41 by Andria Meuse (NP)

## 2014-07-21 NOTE — Discharge Summary (Signed)
PATIENT NAME:  Betty Dunn, DIFATTA MR#:  [id number] DATE OF BIRTH:  02/13/1923  DATE OF ADMISSION:  08/31/2012 DATE OF DISCHARGE:  09/06/2012  FINAL DIAGNOSIS: Locally advanced rectal cancer.   PROCEDURE PERFORMED: Laparotomy with diverting loop transverse colostomy on 08/31/2012.   HOSPITAL COURSE SUMMARY: The patient had an uneventful stay. On postoperative day 1, she had some ostomy output. On postoperative day 2, she continued to improve. Her ostomy was viable. Wound was healing nicely. On postoperative day 3, she continued to improve. Pain was well controlled. On postoperative day 4 and 5, the patient had normal white count with diet that will be advanced and she will be discharged home on postoperative day 6 in stable condition. Her ostomy bridge was removed prior to her discharge. She will follow up with me in the office in 7 to 10 days for staple removal.   DISCHARGE MEDICATIONS: Can be found on the reconciliation form.  ____________________________ Jeannette How. Marina Gravel, MD mab:aw D: 09/20/2012 13:12:17 ET T: 09/20/2012 13:47:45 ET JOB#: [id number]  cc: Elta Guadeloupe A. Marina Gravel, MD, <Dictator> Hortencia Conradi MD ELECTRONICALLY SIGNED 09/20/2012 16:25

## 2014-07-21 NOTE — Op Note (Signed)
PATIENT NAME:  Betty Dunn, Betty Dunn MR#:  [id number] DATE OF BIRTH:  [birth year]  DATE OF PROCEDURE:  08/31/2012  PREOPERATIVE DIAGNOSIS: Locally advanced and possibly metastatic low rectal cancer.   POSTOPERATIVE DIAGNOSIS:  Locally advanced and possibly metastatic low rectal cancer.   PROCEDURE PERFORMED:  1.  Diverting loop colostomy, transverse colon with minilaparotomy.  2.  Right subclavian vein Port-A-Cath insertion with intraoperative use of fluoroscopy and venography.   SURGEON: Sherri Rad, M.D.   ASSISTANT: Scrub tech.  ANESTHESIA: General endotracheal.   FINDINGS:  1.  Normal appearing transverse colon. No evidence of peritoneal disease.  2.  Adhesions in the right upper quadrant from previous open cholecystectomy.   ESTIMATED BLOOD LOSS: Minimal.   DESCRIPTION OF PROCEDURE: With informed consent, supine position, general oral endotracheal anesthesia, the patient's neck and upper chest on the right side was sterilely prepped and draped with ChloraPrep solution and Ioban. Timeout was observed. Utilizing the handheld ultrasound, the course of the jugular vein on the right side was identified and multiple attempts were used to under guidance to obtain access. This was unsuccessful. On first attempt, the subclavian vein was cannulated with the easy passage of the guidewire. Fluoroscopy demonstrated the wire to be in the proper chambers of the heart. A subcutaneous pocket was then fashioned on the anterior chest wall through a transverse skin incision. A small incision was fashioned over the wire. Tunneling was then performed between these two sites. Peel-away introducer was placed into the subclavian vein. The catheter was then inserted through the peel-away introducer and peel-away introducer was removed. Catheter was then shortened to the appropriate length, flushed and aspirated easily, Constrast was injected into tubing demonstarting proer location.  The tubing was connected to the  reservoir external to the pocket, flushed and aspirated easily with normal saline and final flush of 1000 units/mL heparinized saline. The reservoir was then secured to the clavipectoral fascia at two points with 3-0 PDS suture. The deep layer of the pocket was then obliterated with 3-0 Vicryl, 4-0 Vicryl and the skin and interrupted 4-0 nylon in the skin. Dermabond was then applied. A sterile occlusive dressing was placed.   Attention was then turned to the abdomen. The abdomen was widely prepped and draped with ChloraPrep solution and timeout was observed. A short transverse skin incision was fashioned in the mid rectus sheath on the right side. Muscle-splitting technique was used. The peritoneum was entered sharply. The colon was somewhat dilated. I could not bring up all of the colon through this incision and in doing so a small 2 mm tear was found on the mesenteric border of the transverse colon.  At this point, I elected to perform a mini laparotomy through an upper midline skin incision. Adhesiolysis in the right upper quadrant was undertaken of omentum and colon off the anterior abdominal wall and the undersurface of the liver with electrocautery. Sufficient length was able to be obtained on the colon. The small colotomy was closed with multiple interrupted 3-0 silk sutures in Lembert fashion and an adjacent epiploic fat pad was used to patch the area. A window was fashioned just underneath the bowel through the edge of the mesentery with blunt technique. A 16 French Robinson catheter was brought through this defect. The colon was then brought up to the ostomy site. It was secured at multiple sites of the fascia with 3-0 silk suture. The catheter was then used as an ostomy bridge exiting both superiorly and inferiorly to the ostomy site. The  midline fascia was then reapproximated utilizing running #1 Vicryl. Skin edges were reapproximated utilizing a skin stapler. Sterile dressings were placed.   The  loop transverse colon was then opened along its longitudinal axis with electrocautery and a standard Brooke colostomy was fashioned with 3-0 chromic suture. Ostomy appliance was placed. The patient was subsequently extubated and taken to the recovery room in stable and satisfactory condition by anesthesia services.     ____________________________ Jeannette How Marina Gravel, MD mab:cc D: 08/31/2012 21:03:20 ET T: 08/31/2012 22:08:20 ET JOB#: [id number]  cc: Elta Guadeloupe A. Marina Gravel, MD, <Dictator> Lucilla Lame, MD Modesto Charon, MD Rae Halsted. Kallie Edward, MD Hortencia Conradi MD ELECTRONICALLY SIGNED 09/01/2012 0:06

## 2014-07-21 NOTE — Consult Note (Signed)
Brief Consult Note: Diagnosis: Sigmoid Cancer.   Patient was seen by consultant.   Recommend further assessment or treatment.   Discussed with Attending MD.   Comments: Patient with partially obstructing sigmoid mass that presented with rectal bleeding. At present,patient in no distress, no pain and good performance status. History of CABG in 2012 with no symptoms at present. Will repeat ECHO and have cardiology input in terms of EF. This will help determine her pre-op status and depending on this will plan for surgery vs chemoradiation. Plan discussed with patient and her son. Consult appreciated.  Electronic Signatures: Georges Mouse (MD)  (Signed 08-May-14 17:09)  Authored: Brief Consult Note   Last Updated: 08-May-14 17:09 by Georges Mouse (MD)

## 2014-07-21 NOTE — Consult Note (Signed)
PATIENT NAME:  Betty Dunn, Betty Dunn MR#:  [id number] DATE OF BIRTH:  November 19, 1922  ADDENDUM  DATE OF CONSULTATION:  08/03/2012  CONSULTING PHYSICIAN:  Andria Meuse, NP  PLAN:  1.  She will undergo colonoscopy with Dr. Verl Blalock tomorrow. 2.  Standard prep today with half-normal volume. 3.  NPO after midnight; she may have clear liquids this evening. 4.  [place] surgical consult for possible colonic mass. 5.  Consider MRCP for pancreatic duct dilation at a later date.   ____________________________ Andria Meuse, NP klj:mr D: 08/03/2012 17:30:00 ET T: 08/03/2012 18:59:03 ET JOB#: [id number]  cc: Andria Meuse, NP, <Dictator> Andria Meuse FNP ELECTRONICALLY SIGNED 09/01/2012 11:14

## 2014-07-21 NOTE — Consult Note (Signed)
PATIENT NAME:  Betty Dunn, Betty Dunn MR#:  [id number] DATE OF BIRTH:  [birth year]  DATE OF CONSULTATION:  08/03/2012  REFERRING PHYSICIAN:  Dr. Abel Presto  CONSULTING PHYSICIAN:  Andria Meuse, NP  PRIMARY CARE PHYSICIAN:  Dr. Teresa Pelton   REASON FOR CONSULTATION:  Rectal bleeding, possible colonic mass.    HISTORY OF PRESENT ILLNESS:  Betty Dunn is an 81 year old female who was being admitted for large volume rectal bleeding.  She has been having intermittent, scant hematochezia over the past several years. More recently over the last 6 months, she has had heavier bleeding.  She was admitted approximately 2 weeks ago with similar symptoms and was scheduled to have an outpatient colonoscopy.  Around 10:00 last night, she began to have large amounts of bright red blood with clots and she presented to the Emergency Department.  She did have some lower abdominal cramping, what she describes as "gas pains."  However, it has completely resolved.  She has had multiple episodes of loose stools, too numerous to count on some days of the week.  She has been taking aspirin 81 mg daily three times a week, but denies other NSAID use.  She denies any nausea, vomiting, anorexia, dysphagia or odynophagia.  She denies any heartburn or indigestion.  She has lost approximately 5 pounds over the last year.  She has never had a colonoscopy.  CT scan of the abdomen and pelvis with IV normal contrast from 07/23/2012 shows large amount of fecal material to the rectosigmoid region with abnormal appearance, concerning for stenosis with wall thickening.  The rectum is nondistended with a thickened wall.  Surrounding fatty tissue show slightly increased density.  There are some perirectal and pericolonic lymph nodes including one with a diameter of 8.1 mm along the posterior left lateral aspect.  Additional nodes are seen along the left pelvic sidewall.  Scattered colonic diverticulosis is seen.  She also has an incidental  finding of pancreatic duct dilation with an abnormal appearance suggestive of possible soft tissue density.  She has mild intra and extrahepatic biliary ductal dilation.  Radiologist suggests MRCP.    PAST MEDICAL AND SURGICAL HISTORY:  Coronary artery disease status post CABG, hypertension, hyperlipidemia, hypothyroidism, complete hysterectomy, arthritis, hemorrhoidal banding.   MEDICATIONS PRIOR TO ADMISSION:  Simvastatin 40 mg daily, Senna 0.6 mg twice a week, potassium chloride 20 mEq/15 mL 3 teaspoons daily, lisinopril 10 mg daily, levothyroxine 25 mg daily, Lasix 40 mg daily, carvedilol 6.25 mg b.i.d., aspirin 81 mg 3 times per week; Monday, Wednesday, Friday.   ALLERGIES:  AMLODIPINE (UNKNOWN REACTION), MILK OF MAGNESIA (ITCHING), CIPRO (NAUSEA AND VOMITING), PENICILLIN (UNKNOWN REACTION), SULFA (UNKNOWN REACTION).     FAMILY HISTORY:  There is no known family history colorectal carcinoma, liver or chronic GI problems.  Mother deceased at age 33 due to old age.  Father deceased at 78 with history of CVA.  She has lost 2 siblings and has two brothers and one sister alive.  She has one son with diabetic gastroparesis.    SOCIAL HISTORY:  She is a widow, lives alone at [facility].  She has one son nearby.  She denies any tobacco, alcohol or drug use.    REVIEW OF SYSTEMS:  See HPI.  Otherwise, negative complete review of systems.    PHYSICAL EXAMINATION: VITAL SIGNS:  Temperature 98.2, pulse 63, respirations 20, blood pressure 128/89.  GENERAL: She is a well-developed, well-nourished elderly Caucasian female who is alert, oriented, pleasant, cooperative and in no  acute distress.  HEENT:  Sclerae clear and nonicteric.  Conjunctivae are pink.  Oropharynx is pink and moist without any lesions.  NECK:  Supple without any masses or thyromegaly.   HEART:  Regular rate and rhythm.  Normal S1, S2 without murmurs, clicks, rubs or gallops.   LUNGS:  Clear to auscultation bilaterally.   ABDOMEN:   Positive bowel sounds x 4, no bruits auscultated. Soft, nontender, nondistended without palpable mass or splenomegaly.  No rebound, tenderness or guarding.   EXTREMITIES:  Without edema bilaterally.  SKIN: Pink, warm and dry without any rash or jaundice.   PSYCHIATRIC:  She is alert and cooperative.    LABORATORY STUDIES:  Hemoglobin 10.8, hematocrit 32.9, white blood cell count 6.6, platelets 186.    Urinalysis shows 1+ blood, 2 RBCs, 74 white blood cells, trace bacteria.    BMP:  Normal, except glucose 121 and anion gap 5.  Serum albumin 3, total protein 6.1, otherwise normal LFTs.    IMAGING:  See HPI.    IMPRESSION: Betty Dunn is an 81 year old Caucasian female with large volume rectal bleeding and mild anemia.  Her Hemoglobin is 10.8 and has been stable over the past month.  She has stenosis and wall thickening of the rectosigmoid colon with adenopathy and retained feces on CT scan abdomen and pelvis with IV and oral contrast from April 25.  Plans were being made for outpatient colonoscopy; however, she developed large volume hematochezia last night and presented to the ER.  She will need a colonoscopy with Dr. Verl Blalock tomorrow with possible biopsies to rule out malignancy.  We will start her prep this evening with  normal prep and Dulcolax.  I have discussed risks and benefits to include, but not limited to bleeding, infection, perforation, or medication reaction and the consent will be obtained.    She is also noted to have a mild intra and extrahepatic biliary dilation on CT. Given normal hepatic function panel, this is likely physiologic status post cholecystectomy, given her advanced age.  She does have a mildly dilated pancreatic duct with possible small soft tissue mass seen on CT.  We will consider further imaging after colonoscopy.     PLAN:  1.  She will undergo colonoscopy with Dr. Allen Norris tomorrow. 2.  Standard prep today with half-normal volume. 3.  NPO after midnight; she may have  clear liquids this evening. 4.  [facility] Surgical consult for possible colonic mass. 5.  Consider MRCP for pancreatic duct dilation at a later date.    We would like to thank Dr. Verdell Carmine for allowing [place] to participate in the care.       ____________________________ Andria Meuse, NP klj:cc D: 08/03/2012 17:28:21 ET T: 08/03/2012 18:54:55 ET JOB#: [id number]  cc: Andria Meuse, NP, <Dictator> Modesto Charon, MD Andria Meuse FNP ELECTRONICALLY SIGNED 08/04/2012 8:39

## 2014-07-21 NOTE — Consult Note (Signed)
Reason for Visit: This 81 year old Female patient presents to the clinic for initial evaluation of  rectal cancer .   Referred by dr. Kallie Edward.  Diagnosis:  Chief Complaint/Diagnosis   81 year old female with clinical stage IIIB (T3 N2 a M0) adenocarcinoma of the distal rsigmoid colon  Pathology Report pathology report reviewed   Imaging Report CT scan and PET CT scan reviewed   Referral Report clinical notes reviewed   Planned Treatment Regimen concurrent preop chemoradiation   HPI   patient is an 81 year old female who was admitted to the hospital for GI bleed and underwent colonoscopy showing a obstructing lesion of the distal sigmoid colon with biopsy positive for well-differentiated invasive adenocarcinoma. She has gone on to have a PET CT scan which shows hypermetabolic activity in the distal rectum carcinoid to the area of tumor involvement as well as multiple perirectal lymph nodes which are also hypermetabolic consistent with local regional advanced disease. She is scheduled next week for a diverting colostomy as well supported placement in preparation for concurrent chemoradiation prior to surgical removal of her rectal cancer. She seen today for radiation oncology opinion. She is doing fairly well. Still having occasional bright red blood per rectum. She's also fairly weak although she is hungry.she's also having pain on defecation which as been fairly consistent over the past several months.  Past Hx:    Osteoarthritis:    NSTEMI:    Hypokalemia:    Hypothyroidism:    HTN:    CABG:    Hysterectomy - Total:    Cholecystectomy:   Past, Family and Social History:  Past Medical History positive   Cardiovascular CABG performed; hypertension; hypokalemia   Endocrine hypothyroidism   Past Surgical History cholecystectomy; otal hysterectomy   Family History positive   Family History Comments family history of cardiovascular disease as well as stroke   Social  History noncontributory   Additional Past Medical and Surgical History accompanied by multiple family members today   Allergies:   Cipro: Rash  Milk of Magnesia: GI Distress  Amlodipine Besylate: Unknown  Penicillin: Unknown  Sulfa drugs: Unknown  Home Meds:  Home Medications: Medication Instructions Status  polyethylene glycol 3350 oral powder for reconstitution 17 gram(s) orally once a day, As needed, constipation Active  lisinopril 10 mg oral tablet 1 tab(s) orally once a day  Active  simvastatin 40 mg tablet 1 tab(s) orally once a day (at bedtime)  Active  levothyroxine 25 mcg (0.025 mg) oral tablet 1 tab(s) orally once a day Active  carvedilol 6.25 mg oral tablet 1 tab(s) orally 2 times a day (with meals) Active  furosemide 40 mg tablet 1 tab(s) orally once a day, As Needed for ankle swelling (take with potassium) Active  potassium chloride 20 mEq/15 mL oral liquid 15 milliliter(s) (3 teaspoonsful) orally once a day, As Needed for ankle swelling (take with furosemide) Active  Senna 8.6 mg oral tablet 1 tab(s) orally 2 times a week on Sunday and Wednesday, As Needed - for Constipation Active   Review of Systems:  General negative   Performance Status (ECOG) 1   Skin negative   Breast negative   Ophthalmologic negative   ENMT negative   Respiratory and Thorax negative   Cardiovascular negative   Gastrointestinal see HPI   Genitourinary negative   Musculoskeletal negative   Neurological negative   Psychiatric negative   Hematology/Lymphatics negative   Endocrine negative   Allergic/Immunologic negative   Review of Systems   except for bright  red blood per direct them according to the nurse's notesPatient denies any weight loss, fatigue, weakness, fever, chills or night sweats. Patient denies any loss of vision, blurred vision. Patient denies any ringing  of the ears or hearing loss. No irregular heartbeat. Patient denies heart murmur or history of fainting.  Patient denies any chest pain or pain radiating to her upper extremities. Patient denies any shortness of breath, difficulty breathing at night, cough or hemoptysis. Patient denies any swelling in the lower legs. Patient denies any nausea vomiting, vomiting of blood, or coffee ground material in the vomitus. Patient denies any stomach pain. Patient states has had normal bowel movements no significant constipation or diarrhea. Patient denies any dysuria, hematuria or significant nocturia. Patient denies any problems walking, swelling in the joints or loss of balance. Patient denies any skin changes, loss of hair or loss of weight. Patient denies any excessive worrying or anxiety or significant depression. Patient denies any problems with insomnia. Patient denies excessive thirst, polyuria, polydipsia. Patient denies any swollen glands, patient denies easy bruising or easy bleeding. Patient denies any recent infections, allergies or URI. Patient "s visual fields have not changed significantly in recent time.  Nursing Notes:  Nursing Vital Signs and Chemo Nursing Nursing Notes: *CC Vital Signs Flowsheet:   30-May-14 11:35  Temp Temperature 98  Pulse Pulse 74  Respirations Respirations 18  SBP SBP 96  DBP DBP 61  Current Weight (kg) (kg) 57.5   Physical Exam:  General/Skin/HEENT:  General normal   Skin normal   Eyes normal   ENMT normal   Head and Neck normal   Additional PE thin pale female in NAD wheelchair-bound. Lungs are clear to A&P cardiac examination shows regular rate and rhythm. Abdomen is benign with no organomegaly or masses noted. No peripheral edema in her lower extremities is identified.   Breasts/Resp/CV/GI/GU:  Respiratory and Thorax normal   Cardiovascular normal   Gastrointestinal normal   Genitourinary normal   MS/Neuro/Psych/Lymph:  Musculoskeletal normal   Neurological normal   Lymphatics normal   Other Results:  Radiology Results: LabUnknown:     25-Apr-14 06:33, CT Abdomen and Pelvis With Contrast  PACS Image     30-May-14 10:22, PET/CT Scan Colorectal Cancer Diagnosis  PACS Image   CT:    25-Apr-14 06:33, CT Abdomen and Pelvis With Contrast  CT Abdomen and Pelvis With Contrast   REASON FOR EXAM:    (1) rectal bleeding, LLQ pain; (2) rectal bleeding,   LLQ pain  COMMENTS:       PROCEDURE: CT  - CT ABDOMEN / PELVIS  W  - Jul 23 2012  6:33AM     RESULT: Emergent CT of the abdomen and pelvis is performed with 100 mL of   Isovue-300 iodinated intravenous contrast and oral contrast. Images are   reconstructed at 3 mm slice thickness in the axial plane. The patient has   no previous studies for comparison.    Images through the base the lungs demonstrate some respiratory motion  artifact with some lingular atelectasis. There is no effusion or   pneumothorax. There is no infiltrate or mass. The heart is not enlarged.   The patient is status post cholecystectomy. There is mild intrahepatic     and extrahepatic biliary ductaldilation which can be seen in post   cholecystectomy patients. Correlate with laboratory studies. The   pancreatic duct is dilated with an abnormal appearance suggestive on   image 40 with possible soft tissue density in the  pancreatic duct.   M.R.C.P. is recommended. No focal pancreatic mass is evident. There is a   large amount of fecal material in the colon to the rectosigmoid region   where there is an abnormal appearance concerning for stenosis with wall   thickening. Primary concern is for underlying malignancy. The possibility   of inflammation is not excluded. The rectum is nondistended with a   thickened wall. The surrounding fatty tissues show slightly increased   density. There are some perirectal/pericolonic lymph nodes including one   on image 119 with a diameter of 8.1 mm along the posterior left lateral   aspect. These could be seen in situations of malignancy or inflammation.   Additional nodes  are seen along the left pelvic sidewall. The urinary   bladder is nondistended.There is a cystic area in the left pelvis which     could represent left ovarian cyst. This has a Hounsfield reading of 5.0   with a diameter of approximately 2.7 cm on image 115. No small bowel   distention is seen area there is additional low-attenuation in the right   adnexal region which is smaller 2.2 cm with a Hounsfield reading of 7.0.   Scattered colonic diverticulosis is seen without evidence of acute   diverticulitis. The appendix is not identified. The abdominal aorta is   normal in caliber. The spleen, adrenal glands and kidneys appear to be   within normal limits. No definite hepatic mass is appreciated. The bony   structures show diffuse degenerative disc narrowing especially at L1-L2   and L2-L3 along with T11-T12 and T12-L1. Atherosclerotic calcification is   present fairly prominently at the origin of the superior mesenteric   artery and around the celiac origin. Prominent atherosclerotic density is   seen at the origin both renal arteries.    IMPRESSION:   1. Findings concerning for low colonic malignancy with significant   retention of fecal material proximally. Sigmoidoscopy for biopsy is   recommended. There are some noted within the perirectal tissue and along   the pelvic sidewall.  2. Abnormal appearance the pancreatic duct which could represent   underlying small soft tissue density. Correlate with M.R.C.P.  3. Significant atherosclerotic disease.  4. Degenerative changes in the spine.  5. Bilateral ovarian cysts.    Dictation Site: 1(*)        Verified By: Sundra Aland, M.D., MD  Nuclear Med:    [id number] 10:22, PET/CT Scan Colorectal Cancer Diagnosis  PET/CT Scan Colorectal Cancer Diagnosis   REASON FOR EXAM:    Rectal mass  COMMENTS:       PROCEDURE: PET - PET/CT DX COLORECTAL CA  - Aug 27 2012 10:22AM     RESULT: The patient has a fasting blood glucose level of  107 mg/dL. The   patient received an injection of 12.26 mCi of fluorine 18 labeled   fluorodeoxyglucose in the left antecubital region at 8:59 a.m. Imaging is   are obtained from the base of the brain into the thighs between the hours   of 9:58 a.m. and 10:16 a.m. Noncontrast low-dose CT is performed the same   regions for the purposes of attenuation correction and fusion. The   low-dose noncontrast CT data, attenuation corrected PET data and fused   PET CT data sets are reconstructed by the [employer] software in the   axial, coronal and sagittal planes. A rotating 3-dimensional maximum   intensity projection PET image is also created. The patient has no  previous exam for comparison.    There is large amount of activity in the area the rectum and rectosigmoid   with a small area along the left pelvic sidewall nearthe level of the   acetabular rim consistent with regional metastatic involvement within   lymph nodes. The main focus of activity in the rectosigmoid region shows   a maximum SUV of 16.54 with a mean of 9.34. The area along the left   pelvis shows amaximum SUV of 8.7 with a mean of 5.1. There is an   additional nodular focus in the presacral region posterior to the sigmoid   colon likely representing involvement of the prominent lymph node which   is less intense showing a maximum SUV of 3.57 and a mean of 2.28. There   is a moderately large amount of fecal material within the colon.   Extensive atherosclerotic calcification is present. There is a solitary   punctate area of abnormal accumulation within the left lung and   noncalcified nodular density showing a maximum SUV of 4.96 with a mean of     2.84. No additional abnormal areas of activity accumulation are seen. No   renal calculi are evident. Cholecystectomy clips are present. Prominent   coronary artery atherosclerotic calcification is present. CABG changes   are demonstrated.    IMPRESSION:   1. Large uterine  activity in the rectosigmoid region consistent with a   history of rectal cancer. Regional metastatic involvement laterally in   the left pelvis and in the presacral region likely in regional lymph   nodes. There is a solitary focus in the left lung upper lobe region near   the level of the aortic arch consistent with malignancy. Differential   consideration could be a focal single metastatic lesion or concomitant   primary lung malignancy.    Dictation Site: 1    Verified By: Sundra Aland, M.D., MD   Relevent Results:   Relevant Scans and Labs PET/CT scan and CT scans are reviewed   Assessment and Plan: Impression:   locally advanced adenocarcinoma of the distal sigmoid colon in 81 year old female. Plan:   this time I agree with going ahead with concurrent preoperative chemoradiationprior to resection of her distal sigmoid colon adenocarcinoma. She saw rescheduled for diverting colostomy next week as well as port placement. I have set her up for CT simulation the following week. Would plan on delivering approximately 5000 cGy with concurrent chemotherapy to her whole pelvis as well as boosting the area of primary tumor involvement. Risks and benefits of treatment including diarrhea, fatigue, skin reaction, and alteration blood counts oral explained in detail to the patient and her family. They all seem to comprehend my treatment plan well. I discussed the case personally with Dr. Kallie Edward.  I would like to take this opportunity to thank you for allowing me to continue to participate in this patient's care.  CC Referral:  cc: Dr. Teresa Pelton, Dr. Lucilla Lame   Electronic Signatures: Baruch Gouty, Roda Shutters (MD)  (Signed 30-May-14 11:57)  Authored: HPI, Diagnosis, Past Hx, PFSH, Allergies, Home Meds, ROS, Nursing Notes, Physical Exam, Other Results, Relevent Results, Encounter Assessment and Plan, CC Referring Physician   Last Updated: 30-May-14 11:57 by Armstead Peaks (MD)

## 2014-07-21 NOTE — H&P (Signed)
PATIENT NAME:  Betty Dunn, BURGIN MR#:  [id number] DATE OF BIRTH:  1922/05/24  DATE OF ADMISSION:  08/03/2012  PRIMARY CARE PHYSICIAN: Dr. Damita Dunnings  CHIEF COMPLAINT: Bright red blood per rectum.   HISTORY OF PRESENT ILLNESS: This is an 81 year old female who comes into the Emergency Room due to multiple episodes of bright red blood per rectum last night. She also complained of lower abdominal pain along with her bright red blood per rectum. It since then resolved and since she has been in the Emergency Room, she has had no further episodes of bleeding. She was in the Emergency Room about 2 weeks ago with similar complaints, underwent a CT scan of the abdomen and pelvis, which showed possible lower clonic mass with some retained fecal material. She was set up for an outpatient colonoscopy coming up next week. Since her bleeding got worse, she came to the ER for further evaluation. Hospitalist services were contacted for further treatment and evaluation. The patient presently denies any chest pain, shortness of breath, any nausea, vomiting, abdominal pain, fevers, chills, cough, weight loss or any other associated symptoms presently.   REVIEW OF SYSTEMS: CONSTITUTIONAL: No documented fever. No weight gain or weight loss.  EYES: No blurry or double vision.  ENT: No tinnitus. No postnasal drip. No redness of oropharynx.  RESPIRATORY: No cough, no wheeze, no hemoptysis, no dyspnea.  CARDIOVASCULAR: No chest pain, no orthopnea, no palpitations or syncope.  GASTROINTESTINAL: No nausea, no vomiting, no diarrhea. Positive abdominal pain. No melena. Positive hematochezia.  GENITOURINARY: No dysuria. No hematuria.  ENDOCRINE: No polyuria or nocturia. No heat or cold intolerance.  HEMATOLOGIC: No anemia, no acute bruising.  INTEGUMENTARY: No rashes. No lesions.  MUSCULOSKELETAL: No arthritis. No swelling. No gout.  NEUROLOGIC: No numbness or tingling. No ataxia. No seizure-type activity.  PSYCHIATRIC: No  anxiety. No insomnia. No ADD.   PAST MEDICAL HISTORY: Consistent with history of coronary artery disease status post CABG, hypertension, hyperlipidemia, history of hypothyroidism.   ALLERGIES: AMLODIPINE, CIPRO, MILK OF MAGNESIA, PENICILLIN AND SULFA. EXACT REACTION TO DRUGS IS UNKNOWN.   SOCIAL HISTORY: No smoking. No alcohol abuse. No illicit drug abuse. Lives at home with her son.   FAMILY HISTORY: The patient's mother died from complications of a heart attack at age 48. Father died from a stroke at age 52.   CURRENT MEDICATIONS: Aspirin 81 mg on Monday, Wednesday, Friday, Coreg 6.25 mg b.i.d., Lasix 40 mg daily as needed, Synthroid 25 mcg daily, lisinopril 10 mg daily, potassium 20 meq as needed, Senokot 1 tab b.i.d. weekly on Sunday, Wednesday, simvastatin 40 mg at bedtime.   PHYSICAL EXAMINATION ON ADMISSION: Is as follows:   VITALS SIGNS:  Are noted to be: Temperature is 98.2, pulse 62, respirations 20, blood pressure 120/89, sats 94% on room air.  GENERAL: She is a pleasant appearing female in no apparent distress.  HEENT: Atraumatic, normocephalic. Extraocular muscles are intact. Pupils are equal and reactive to light. Sclerae anicteric. No conjunctival injection. No pharyngeal erythema.  NECK: Supple. There is no jugular venous distention. No bruits, no lymphadenopathy, no thyromegaly.  HEART: Regular rate and rhythm. No murmurs, no rubs, no clicks.  LUNGS: Clear to auscultation bilaterally. No rales, no rhonchi. No wheezes.  ABDOMEN: Soft, flat, nontender, nondistended. Has good bowel sounds. No hepatosplenomegaly appreciated.  EXTREMITIES: No evidence of any cyanosis, clubbing or peripheral edema. Has +2 pedal and radial pulses bilaterally.  NEUROLOGICAL: The patient is alert, awake, and oriented x 3 with no focal motor  or sensory deficits appreciated bilaterally.  SKIN: Moist and warm with no rash appreciated.  LYMPHATIC: There is no cervical or axillary lymphadenopathy.    LABORATORY, DIAGNOSTIC AND RADIOLOGIC DATA:  Serum glucose 121, BUN 16, creatinine 1.03, sodium 137, potassium 4.0, chloride 104, bicarbonate 28. LFTs are within normal limits. White cell count 6.6, hemoglobin 10.8, hematocrit 32.9, platelet count 186. Urinalysis showed 3+ leukocyte esterase with 74 white cells and trace bacteria.   ASSESSMENT AND PLAN: This is an 81 year old female with history of coronary artery disease status post CABG, hypertension, hyperlipidemia, hypothyroidism, who presents to the hospital with multiple episodes of bright low blood per rectum overnight and also incidentally noted to have a urinary tract infection.  1.  Gastrointestinal bleed. This is the likely cause of the patient's bright red blood per rectum. This is likely a lower gastrointestinal bleed. The patient presented to the hospital 2 weeks ago with similar complaints and had a CT of the abdomen and pelvis done, which showed possible lower colonic mass and of retained fecal material. She was set up to have outpatient colonoscopy and now presents with acute bleeding. Her hemoglobin has not really changed much in 2 weeks. She is hemodynamically stable. She is clinically asymptomatic otherwise. For now, we will hold her aspirin, place her on a clear liquid diet. She likely needs a colonoscopy. I discussed the case with Dr. Verl Blalock, who will see the patient and possibly do a colonoscopy tomorrow. Follow serial hemoglobins and follow her clinically for now.  2.  Urinary tract infection. She is clinically asymptomatic. For now, I will start on IV ceftriaxone, follow urine cultures.   3.  History of coronary gastrointestinal bleed.  4.  Hypothyroidism.  Continue Synthroid. 5.  Hypertension.  Continue Coreg, continue lisinopril.  6.  Hyperlipidemia. Continue simvastatin.   CODE STATUS: The patient is a full code.   TIME SPENT ON ADMISSION: 50 minutes    ____________________________ Belia Heman. Verdell Carmine,  MD vjs:cc D: 08/03/2012 15:54:50 ET T: 08/03/2012 16:11:24 ET JOB#: [id number]  cc: Belia Heman. Verdell Carmine, MD, <Dictator> Henreitta Leber MD ELECTRONICALLY SIGNED 08/04/2012 15:28

## 2014-07-21 NOTE — H&P (Signed)
PATIENT NAME:  Betty Dunn, Betty Dunn MR#:  [id number] DATE OF BIRTH:  02-14-1923  DATE OF ADMISSION:  09/30/2012  REFERRING PHYSICIAN: Dr. Charlesetta Ivory.   PRIMARY CARE PHYSICIAN: Dr. Damita Dunnings.   PRIMARY SURGEON: Dr. Marina Gravel.   PRIMARY ONCOLOGY: Dr. Kennieth Francois.   CHIEF COMPLAINT: Syncope.   HISTORY OF PRESENT ILLNESS: This is an 81 year old female with the recent diagnosis of rectal cancer, status post diverting colostomy by Dr. Marina Gravel the end of June. The patient is clinically stage IIIB, where currently she is receiving chemotherapy and radiation. The patient presents today with syncope. The patient reports she has not been feeling well over the last few days. She has been feeling progressively weak, had poor appetite, decreased p.o. intake both for solids and liquids. As well, she has been having diarrhea where her colostomy bag has been filling more frequently and where actually did burst open earlier today. The patient was sitting at the table with the family, where she felt dizzy, lightheaded and then where she had episode of loss of consciousness, lasted for 1 to 2 minutes as per her son who was present there. No head trauma. No fall. She was on the chair, and after that period of syncope, the patient had a couple of episodes of vomiting but denies any coffee-ground emesis. As well, the patient was found to have a positive urinalysis in the ED, as well has signs of clinical dehydration. The patient received 1 liter of normal saline, where she reports significant improvement of her symptoms and currently reports "I feel much better." The patient's CT head without contrast did not show any acute findings. Her chest x-ray did show improving atelectasis. As well, the patient has been complaining of occasional small amount of bright red blood per rectum over the last few weeks. Hospitalist service was requested to admit the patient for further management and workup of her syncope and dehydration. As well,  the family does request placement as they think they cannot take care of her at home.   PAST MEDICAL HISTORY:  1. Recent diagnosis of rectal cancer, status post diverting colostomy by Dr. Marina Gravel, currently on chemotherapy and radiation.  2. Hypothyroidism.  3. Hypertension.  4. Hyperlipidemia.  5. Coronary artery disease, status post CABG.   ALLERGIES:  1. AMLODIPINE.  2. CIPRO.  3. MILK OF MAGNESIA.  4. PENICILLIN.  5. SULFA.   SOCIAL HISTORY: No smoking. No alcohol abuse. No illicit drug use. Lives at home with her son.   FAMILY HISTORY: The patient's mother died from complication of heart attack at the age of 73. Father died from CVA at the age of 3.   HOME MEDICATIONS:  1. Tylenol as needed.  2. Percocet as needed.  3. Simvastatin 40 mg oral at bedtime.  4. Coreg 6.25 mg oral 2 times a day.  5. Lasix 40 mg as needed for swelling. The patient reports she has not been using it recently.  6. Potassium chloride 20 mEq to use as needed when she uses her Lasix.  7. Levothyroxine 25 mcg oral daily.   REVIEW OF SYSTEMS:  CONSTITUTIONAL: The patient denies any fever or chills but complains of generalized weakness and fatigue and weight loss.  EYES: Denies blurry vision, double vision, inflammation, glaucoma.  ENT: Denies tinnitus, ear pain, epistaxis or discharge.  RESPIRATORY: Denies cough, wheezing, hemoptysis, COPD, dyspnea.  CARDIOVASCULAR: Denies chest pain, edema, arrhythmia, palpitation. Had episode of syncope.  GASTROINTESTINAL: Complains of nausea, vomiting, diarrhea and mild bright red blood  per rectum. Denies any melena, coffee-ground emesis or jaundice. Has watery diarrhea in colostomy frequently.  GENITOURINARY: Denies dysuria, hematuria, renal colic.  ENDOCRINE: Denies polyuria, polydipsia, heat or cold intolerance.  HEMATOLOGY: Denies easy bruising, bleeding, diathesis, anemia.  INTEGUMENTARY: Denies acne, rash or skin lesions.  MUSCULOSKELETAL: Denies any gout,  arthritis or cramps.  NEUROLOGIC: Denies any CVA, TIA, seizures, dementia, ataxia. Has complaints of dizziness and lightheadedness.  PSYCHIATRIC: Denies anxiety, insomnia, bipolar disorder, schizophrenia.   PHYSICAL EXAMINATION:  VITAL SIGNS: Temperature 98.2, pulse 81, respiratory rate 18, blood pressure 109/56, saturating 98% on room air.  GENERAL: Currently ill-appearing female, looks comfortable in bed, in no apparent distress.  HEENT: Head atraumatic, normocephalic. Pupils equal and reactive to light. Pink conjunctivae. Anicteric sclerae. Dry oral mucosa. Cracked lips.  NECK: Supple. No thyromegaly. No JVD.  CHEST: Good air entry bilaterally. No wheezing, rales or rhonchi. Has right chest Port-A-Cath. Site looks clean.  CARDIOVASCULAR: S1, S2 heard. No rubs, murmurs, or gallops.  ABDOMEN: Soft, nontender, nondistended. Bowel sounds present. Has midline surgical scar healed nicely. Has colostomy bag in the right abdomen, draining liquidy fluid but no oozing or bleed from the colostomy site.  EXTREMITIES: No edema. No clubbing. No cyanosis. Dorsalis pedis pulse felt bilaterally.  PSYCHIATRIC: Appropriate affect. Awake, alert x3. Intact judgment and insight.  NEUROLOGIC: Cranial nerves grossly intact. No focal motor or sensory deficits. Motor 5 out of 5.  SKIN: Dry skin turgor. Warm and dry.   PERTINENT LABS: Glucose 118, BUN 22, creatinine 1.27, sodium 133, potassium 3.7, chloride 99, CO2 22. Total protein 6. Albumin 2.7. Total bili 0.6, alk phos 83, AST 15, ALT 12. Troponin less than 0.02. White blood cells 6, hemoglobin 10.3, hematocrit 30.4, platelets 150. Urinalysis showing 5 white blood cells, +1 leukocyte esterase.   CT head without contrast showing no acute abnormality. Chronic ischemic changes and atrophy.   Chest x-ray, PA and lateral, showing prior CABG. No acute abnormality. Port-A-Cath in good position. Previously identified lingular infiltrate demonstrates near complete healing.    ASSESSMENT AND PLAN:  1. Syncope: This is most likely due to dehydration as the patient appears to be clinically dehydrated. She has had poor p.o. intake over the last few days. As well, complains of significant diarrhea. Will check orthostatics. Will continue with aggressive hydration for the patient. As well, there is possibly vasovagal reaction as it happened prior to her vomiting, so the patient will be admitted to oncology floor with telemetry monitoring. Will cycle her cardiac enzymes. Will treat her urinary tract infection as well and will consult physical therapy as well. Will encourage her p.o. intake.  2. Rectal cancer: The patient was on chemoradiation as an outpatient. Will consult oncology service.  3. Weakness and deconditioning: Will consult physical therapy. As well, will consult case management as the patient needs placement at a skilled nursing facility as per her request and per family. They feel overwhelmed.  4. Protein calorie malnutrition: The patient is clinically malnourished. Will continue her on Ensure. Will encourage her p.o. intake.  5. Urinary tract infection: Secondary to the patient's significant allergies, will keep her on nitrofurantoin.   6. Hypothyroidism: Continue with Synthroid.  7. Bright red blood per rectum: The patient reports it is a very minimal amount. This is due to her rectal cancer. Will not start her on any chemical anticoagulation. Will monitor her hemoglobin.  8. Deep vein thrombosis prophylaxis: Will have the patient on sequential compression device and thromboembolic device hose and no chemical  anticoagulation secondary to her minimal bright red blood per rectum.  9. Gastrointestinal prophylaxis: On Protonix.   CODE STATUS: Discussed with the patient and son at bedside. The patient reports she is a DNR and she reports she has a pink sheet at home where she states that.   TOTAL TIME SPENT ON ADMISSION AND PATIENT CARE: 55 minutes    ____________________________ Albertine Patricia, MD dse:gb D: 09/30/2012 00:27:58 ET T: 09/30/2012 01:10:05 ET JOB#: [id number]  cc: Albertine Patricia, MD, <Dictator> DAWOOD Graciela Husbands MD ELECTRONICALLY SIGNED 10/01/2012 1:58

## 2014-07-21 NOTE — Discharge Summary (Signed)
PATIENT NAME:  Betty Dunn, Betty Dunn MR#:  [id number] DATE OF BIRTH:  06-23-1922  DATE OF ADMISSION:  09/30/2012 DATE OF DISCHARGE:  10/05/2012  PRIMARY CARE PHYSICIAN: Teresa Pelton, MD  PRIMARY ONCOLOGIST: Rae Halsted. Kallie Edward, MD   PRIMARY RADIATION ONCOLOGIST: Noreene Filbert, MD   DISCHARGE DIAGNOSES: 1.  Syncope secondary to hypovolemia and dehydration.  2.  Chronic watery diarrhea secondary to chemoradiation from her cancer.  3.  Locally advanced rectal adenocarcinoma, status post colostomy placement, on currently chemoradiation.  4.  Coronary artery disease, status post bypass graft surgery.  5.  Hypertension.  6.  Hypothyroidism.  7.  Hyperlipidemia.   DISCHARGE MEDICATIONS:  1.  Simvastatin 40 mg p.o. daily.  2.  Levothyroxine 25 mcg p.o. daily.  3.  Coreg 6.25 mg p.o. b.i.d.  4.  Tylenol 325 mg p.o. q. 4 hours p.r.n. for pain or fever.  5.  Norco 5/325 mg, 0.5 tablet at bedtime as needed for sleep.  6.  Sandostatin 25 mcg subcutaneously every 8 hours.   DISCHARGE DIET: Low-sodium diet.   DISCHARGE ACTIVITY: As tolerated.  FOLLOW-UP INSTRUCTIONS: 1.  Follow up with radiation oncology tomorrow at scheduled time for radiation.  2.  Follow up with the [facility] with Dr. Kennieth Francois in 1 to 2 weeks.  3.  PCP followup in 3 weeks.  4.  Home health physical therapy and nursing.   LABS AND IMAGING STUDIES PRIOR TO DISCHARGE:  WBC 5.8, hemoglobin 9.1, hematocrit 27.6, platelet count 186. Sodium 136, potassium 4.5, chloride 102, bicarb 26, BUN 21, creatinine 1.1, glucose 108 and calcium of 8.0, magnesium 1.5. Cardiac enzymes remained negative while in the hospital. Urinalysis with  1+ bacteria and leukocyte esterase and culture is negative.  Finished treatment with nitrofurantoin.  CT head showing no acute intracranial abnormality and chronic ischemic changes.  Chest x-ray showing prior CABG, no acute abnormality. Port-A-Cath present. Right previous lingular infiltrate  demonstrates clearing.   BRIEF HOSPITAL COURSE: [name] is an 81 year old elderly Caucasian female with past medical history significant for stage IIIA locally advanced rectal adenocarcinoma status post colostomy placement, chemoradiation, coronary artery disease status post bypass graft surgery who is currently receiving chemoradiation and presented to the hospital after she had a syncopal episode.   1.  Syncope:  Likely secondary to dizziness, lightheadedness, hypovolemia from chronic diarrhea. After giving IV fluids after her diarrhea improved, she has not had further syncope in the hospital, and her monitor did not reveal any cardiac abnormalities.  2.  Chronic diarrhea:  She has had watery diarrhea likely secondary to radiation changes to the rectal mucosa, according to the oncologist.  She was started on Sandostatin 25 mcg subcutaneous 3 times a day which has significantly changed the consistency of her stool and decreased her frequency.  So, that medication was stopped for a day in the hospital which restarted her liquidy stools, so she is being discharged on Sandostatin at this time.  4.  Locally advanced stage IIIA rectal adenocarcinoma:  She has had surgery done, colostomy in place, and currently receiving chemoradiation with 5-fluorouracil and also radiation with Dr. Baruch Gouty. She is still getting radiation and will follow up for her radiation treatment tomorrow.  5.  Coronary artery disease status post bypass graft surgery:  Stable.  6.  Urinary tract infection:  Cultures were negative. She finished treatment here with nitrofurantoin.   7.  Hypothyroidism:  Continue Synthroid medication at this time.   Her course has been otherwise uneventful in the hospital.  The patient was hoping to go to a rehab, however, [employer] has not authorized for the patient to go to a skilled nursing facility at this time, so patient is being discharged home with home health.   DISCHARGE DISPOSITION: Home with  home health.   DISCHARGE CONDITION: Stable.  TIME SPENT ON DISCHARGE:  45 minutes.   ____________________________ Gladstone Lighter, MD rk:cb D: 10/05/2012 16:34:26 ET T: 10/05/2012 22:25:46 ET JOB#: [id number]  cc: Gladstone Lighter, MD, <Dictator> Rae Halsted. Kallie Edward, MD Gladstone Lighter MD ELECTRONICALLY SIGNED 10/25/2012 13:25

## 2014-07-21 NOTE — Consult Note (Signed)
History of Present Illness:  Pathology Report pathology report reviewed   Imaging Report CT scan and PET CT scan reviewed   Referral Report clinical notes reviewed   Planned Treatment Regimen concurrent preop chemoradiation   HPI   patient is an 81 year old female who was admitted to the hospital for GI bleed and underwent colonoscopy showing a obstructing lesion of the distal sigmoid colon with biopsy positive for well-differentiated invasive adenocarcinoma. She has gone on to have a PET CT scan which shows hypermetabolic activity in the distal rectum carcinoid to the area of tumor involvement as well as multiple perirectal lymph nodes which are also hypermetabolic consistent with local regional advanced disease. She is scheduled next week for a diverting colostomy as well supported placement in preparation for concurrent chemoradiation prior to surgical removal of her rectal cancer.   Review of Systems:  General fatigue   Performance Status (ECOG) 2   HEENT no complaints   Lungs no complaints   Cardiac no complaints   GI diarrhea   GU no complaints   Musculoskeletal no complaints   Extremities no complaints   Skin no complaints   Neuro no complaints   Endocrine no complaints   Psych no complaints   Physical Exam:  General patient awake,alert in no acute distress   HEENT: normal   Lungs: clear   Cardiac: regular rate, rhythm   Breast: not examined   Abdomen: soft  nontender  positive bowel sounds  colostomy-watery stool   Skin: intact   Extremities: No edema, rash or cyanosis   Neuro: AAOx3   Psych: normal appearance    Cipro: Rash  Milk of Magnesia: GI Distress  Amlodipine Besylate: Unknown  Penicillin: Unknown  Sulfa drugs: Unknown  Assessment and Plan: Impression:   locally advanced adenocarcinoma of the distal sigmoid colon in 81 year old female on  chemoradiation now with diarrhea and dehydration after commencing chemo with 5FU and  radiation therapy mid June 2014 Plan:   Appreciate medicine care and input.diarrhea probably secondary to chemoradiation. Has improved with IV fluids and hydration.do octreotide if dairrhea persists.be discharged to SNF but will need daily transport for continued radiation therapy.   CC Referral:  cc: Dr. Teresa Pelton, Dr. Lucilla Lame   Electronic Signatures: Georges Mouse (MD)  (Signed 03-Jul-14 15:20)  Authored: HISTORY OF PRESENT ILLNESS, ROS, PE, ALLERGIES, ASSESSMENT AND PLAN, CC Referring Physician   Last Updated: 03-Jul-14 15:20 by Georges Mouse (MD)

## 2014-07-21 NOTE — Discharge Summary (Signed)
PATIENT NAME:  Betty Dunn, Betty Dunn MR#:  [id number] DATE OF BIRTH:  Mar 31, 1923  DATE OF ADMISSION:  08/03/2012 DATE OF DISCHARGE:  08/07/2012  For a detailed note, please take a look at the history and physical done on admission.   DIAGNOSES AT DISCHARGE: Are as follows: Lower gastrointestinal bleed, secondary to a sigmoid mass, likely to be malignant, hypertension, hypothyroidism, hyperlipidemia.   The patient is being discharged on a low-sodium, low-fat, mechanical soft diet.   ACTIVITY: As tolerated.   FOLLOWUP: With Dr. Pat Patrick in the next 1 to 2 weeks. Also follow up with Dr. Kallie Edward from Oncology in the next 1 to 2 weeks.   DISCHARGE MEDICATIONS: Are as follows: Simvastatin 40 mg at bedtime, lisinopril 10 mg daily, Synthroid 25 mcg daily, Coreg 6.25 mg b.i.d., Lasix 40 mg daily as needed, potassium  20 mEq daily as needed when taking Lasix, Senokot 1 tablet b.i.d. on Sundays, Wednesdays as needed and MiraLAX daily as needed for constipation.   PERTINENT STUDIES DONE DURING THE HOSPITAL COURSE: Are as follows:  A colonoscopy done on 08/04/2012 showing a likely malignant sigmoid mass which was biopsied; the pathology results are consistent with a well-differentiated invasive rectal cancer.   [place] COURSE:  Dr. Dia Crawford from general surgery,  Dr. Lucilla Lame from gastroenterology, Dr. Ida Rogue from cardiology, and Dr. Kallie Edward from oncology.   BRIEF HOSPITAL COURSE: This is an 81 year old female with medical problems as mentioned above who presented to the hospital with multiple episodes of bright red blood per rectum. She apparently was supposed to get a colonoscopy as an outpatient as she had a CT scan about a week prior to her admission showing a possible lower colonic mass.   1.  Bright red blood per rectum and lower GI bleed: The likely cause of this is probably the sigmoid mass. Patient apparently had a CT prior to coming into the hospital week ago which  showed a lower colonic mass. She underwent a colonoscopy done by Dr. Lucilla Lame which confirmed a sigmoid mass which was biopsied. The pathology results from the biopsy are back and are consistent with a well-differentiated invasive rectal carcinoma. A surgical consult was obtained. The patient was seen by Dr. Dia Crawford who recommends operating on the patient, but he wanted the patient to be cardiologically cleared, which has been done by Dr. Rockey Situ from cardiology. The patient will follow up with Dr. Dia Crawford as an outpatient to further discuss a plan of care regarding her rectal mass and sigmoid mass, and possible surgery in the near future. The patient continues to have some minimal bleeding but her hemoglobin has remained stable. For now, she will discontinue aspirin.  2.  Sigmoid mass: As mentioned, this was likely a rectal carcinoma as confirmed on pathology report. An oncology consult was obtained. The patient was seen by Dr. Kallie Edward, who also recommended, first, surgical treatment prior to starting adjuvant chemoradiation. The patient will follow up with oncology as an outpatient after she is seen by surgery and the surgical procedure, as planned.  3.  Hypothyroidism: The patient was maintained on her Synthroid. She will resume that.  4.  History of coronary artery disease: The patient had no chest pain. Her aspirin was held due to her gastrointestinal bleed. The patient was seen by cardiology, by Dr. Rockey Situ, who cleared her from a cardiologic perspective. She did undergo a repeat echocardiogram, the results of which are still pending. The patient will continue her statin, beta  blocker and ACE inhibitor, as stated.   THE PATIENT IS A FULL CODE.   She is being discharged home, with followup with surgery and oncology as an outpatient.   Time spent is 45 minutes.    ____________________________ Belia Heman. Verdell Carmine, MD vjs:dm D: 08/07/2012 12:11:24 ET T: 08/08/2012 08:48:50  ET JOB#: [id number]  cc: Belia Heman. Verdell Carmine, MD, <Dictator> Micheline Maze, MD Rae Halsted Kallie Edward, MD Henreitta Leber MD ELECTRONICALLY SIGNED 08/17/2012 19:57

## 2014-07-21 NOTE — Consult Note (Signed)
General Aspect 81 year old female with a hx of CAD, January of 2012 with myocardial infarction. Cardiac catheterization 04/23/2010  revealed left main and three-vessel disease with an ejection fraction of 35%. She underwent coronary artery bypass grafting 04/24/2010 with a left internal mammary artery to left anterior descending, saphenous vein graft to obtuse marginal-1 , saphenous vein graft to distal right coronary,  readmitted to [place] with CHF and pneumonia following the procedure, last seen in clinic 05/2011, presenting with recetal bleed from cancer. cardiology was been consulted for preop evaluation.  She reports that she has been doing well, active takes care of the house, mops, dusts, gets groceries, takes out neighbors garbage with her own. No SOB or chest pain with exertion. No problems really since her CABG. She is tolerating her medications well  Previous Echocardiogram was performed and showed an ejection fraction of 45% with inferior posterior hypokinesis; mild to moderate MR; mild TR.  On last office visit 05/2011, she reported feeling well.  She denies any further chest pain, shoulder pain, shortness of breath. She was active, helping to take care of her son who has diabetes and gastroparesis.   EKG shows normal sinus rhythm with rate 67 beats per minute with T-wave abnormality in V3 to V6, aVF, III  Echo from February 2012 shows ejection fraction 40-45%, moderate to severe inferior wall hypokinesis, posterior wall creases, apical wall hypokinesis, mild to moderate MR, right ventricular systolic pressure 14-97   Present Illness . SOCIAL HISTORY: No smoking. No alcohol abuse. No illicit drug abuse. Lives at home with her son.   FAMILY HISTORY: The patient's mother died from complications of a heart attack at age 69. Father died from a stroke at age 83.   Physical Exam:  GEN well developed, well nourished, no acute distress   HEENT hearing intact to voice, moist oral mucosa    NECK supple   RESP normal resp effort  clear BS   CARD Regular rate and rhythm  No murmur   ABD denies tenderness  soft   EXTR negative edema   SKIN normal to palpation   NEURO motor/sensory function intact   PSYCH alert, A+O to time, place, person, good insight   Review of Systems:  Subjective/Chief Complaint rectal bleeding, trouble passing stool   General: No Complaints   Skin: No Complaints   ENT: No Complaints   Eyes: No Complaints   Neck: No Complaints   Respiratory: No Complaints   Cardiovascular: No Complaints   Gastrointestinal: No Complaints   Genitourinary: No Complaints   Vascular: No Complaints   Musculoskeletal: No Complaints   Neurologic: No Complaints   Hematologic: No Complaints   Endocrine: No Complaints   Psychiatric: No Complaints   Review of Systems: All other systems were reviewed and found to be negative   Medications/Allergies Reviewed Medications/Allergies reviewed     Osteoarthritis:    NSTEMI:    Hypokalemia:    Hypothyroidism:    HTN:    CABG:    Hysterectomy - Total:    Cholecystectomy:   Home Medications: Medication Instructions Status  lisinopril 10 mg oral tablet 1 tab(s) orally once a day  Active  simvastatin 40 mg tablet 1 tab(s) orally once a day (at bedtime)  Active  levothyroxine 25 mcg (0.025 mg) oral tablet 1 tab(s) orally once a day Active  aspirin 81 mg oral tablet 1 tab(s) orally once a day on Monday, Wednesday, and Friday Active  carvedilol 6.25 mg oral tablet 1 tab(s) orally 2  times a day (with meals) Active  furosemide 40 mg tablet 1 tab(s) orally once a day, As Needed for ankle swelling (take with potassium) Active  potassium chloride 20 mEq/15 mL oral liquid 15 milliliter(s) (3 teaspoonsful) orally once a day, As Needed for ankle swelling (take with furosemide) Active  Senna 8.6 mg oral tablet 1 tab(s) orally 2 times a week on Sunday and Wednesday, As Needed - for Constipation Active   Lab  Results:  Routine Chem:  07-May-14 06:36   Glucose, Serum 96  BUN 15  Creatinine (comp) 0.99  Sodium, Serum 139  Potassium, Serum 3.5  Chloride, Serum 102  CO2, Serum 30  Calcium (Total), Serum 9.0  Anion Gap 7  Osmolality (calc) 278  eGFR (African American)  59  eGFR (Non-African American)  51 (eGFR values <44m/min/1.73 m2 may be an indication of chronic kidney disease (CKD). Calculated eGFR is useful in patients with stable renal function. The eGFR calculation will not be reliable in acutely ill patients when serum creatinine is changing rapidly. It is not useful in  patients on dialysis. The eGFR calculation may not be applicable to patients at the low and high extremes of body sizes, pregnant women, and vegetarians.)  Routine Hem:  07-May-14 06:36   WBC (CBC) 9.9  RBC (CBC) 4.03  Hemoglobin (CBC) 12.3  Hematocrit (CBC) 36.5  Platelet Count (CBC) 226  MCV 91  MCH 30.6  MCHC 33.8  RDW 14.0  Neutrophil % 62.4  Lymphocyte % 27.5  Monocyte % 7.3  Eosinophil % 1.9  Basophil % 0.9  Neutrophil # 6.2  Lymphocyte # 2.7  Monocyte # 0.7  Eosinophil # 0.2  Basophil # 0.1 (Result(s) reported on 04 Aug 2012 at 07:06AM.)   EKG:  Interpretation EKG shows NSR with rate 68 bpm, no significant ST or T wave changes    Cipro: Rash  Milk of Magnesia: GI Distress  Amlodipine Besylate: Unknown  Penicillin: Unknown  Sulfa drugs: Unknown  Vital Signs/Nurse's Notes: **Vital Signs.:   08-May-14 03:51  Vital Signs Type Routine  Temperature Temperature (F) 97.7  Celsius 36.5  Temperature Source oral  Pulse Pulse 74  Respirations Respirations 20  Systolic BP Systolic BP 1106 Diastolic BP (mmHg) Diastolic BP (mmHg) 53  Mean BP 73  Pulse Ox % Pulse Ox % 96  Pulse Ox Activity Level  At rest  Oxygen Delivery Room Air/ 21 %    Impression 81year old female with a hx of CAD, January of 2012 with myocardial infarction (inferior wall), CABG after Cardiac catheterization 04/23/2010,   presenting with rectal bleed from cancer. Cardiology was been consulted for preop evaluation.  1) Preop cardiovascular evaluation: No recent angina sx. Very active at baseline Really no problems since CABG in 2012. She would be acceptable risk for surgery. No further testing needed She prefers not to have an ostomy but "would accept it if needed." All decisions seem to go through her son for furtjer discussion. If plan is for surgery, owuld minimize IVF through periop period to avoid systolic and diastolic CHF sx.   2) Rectal Ca; oncology and surgery following.  3)HTN: well controlled. Could wean dosing as BP tolerates. EF last was 45%.  Noi signs of heart failure   Electronic Signatures: GIda Rogue(MD)  (Signed 08-May-14 13:53)  Authored: General Aspect/Present Illness, History and Physical Exam, Review of System, Past Medical History, Home Medications, Labs, EKG , Allergies, Vital Signs/Nurse's Notes, Impression/Plan   Last Updated: 08-May-14 13:53 by GIda Rogue(  MD)

## 2014-07-21 NOTE — Consult Note (Signed)
PATIENT NAME:  Betty, Dunn MR#:  [id number] DATE OF BIRTH:  03-23-1923  DATE OF CONSULTATION:  08/03/2012  REFERRING PHYSICIAN:  [facility] CONSULTING PHYSICIAN:  Rodena Goldmann III, MD PRIMARY CARE PHYSICIAN: Modesto Charon, MD  GASTROENTEROLOGIST:  Lucilla Lame, MD   CHIEF COMPLAINT: Rectal bleeding.   BRIEF HISTORY: Betty Dunn is an 81 year old woman admitted today through the Emergency Room with multiple episodes of bright red blood per rectum. She notes that she has been having some episodes of bleeding over the past year and has felt she was having symptomatic hemorrhoidal disease. She was seen in the Emergency Room approximately 2 weeks ago with similar complaints of bleeding and underwent a CAT scan of the abdomen and pelvis. CAT scan demonstrated significant constipation with a large stool load and a possible lower sigmoid proximal rectal thickening suggestive of possible malignancy. She was set up for outpatient colonoscopy. She had another further significant bleed and was admitted into the hospital for work-up at this time. She denies any significant GI symptoms. She did not have any history of nausea, vomiting, diarrhea or change in bowel habits. She denies any fever or chills. She denies any history of hepatitis, yellow jaundice, pancreatitis, peptic ulcer disease, gallbladder disease or diverticulitis. She has history of coronary artery bypass surgery 2 years ago at [facility] and a documented postoperative ejection fraction of 30 to 35%. She does not have any history of abdominal surgery other than a remote hysterectomy. She does not have any ovaries. Her gallbladder and appendix are still in place to the best of her knowledge.   CURRENT MEDICATIONS: Outlined in her admission note.   REVIEW OF SYSTEMS: Reviewed with the patient, and there are no changes from her previous admission note.   PHYSICAL EXAMINATION: GENERAL: She is comfortable in bed watching television, starting her  bowel prep. She is afebrile. Blood pressure is 140/70, heart rate 68 and regular. Oxygen saturation is normal.  HEENT: No scleral icterus. No pupillary abnormalities. No facial deformities.  NECK: Supple without adenopathy. Trachea is midline, and she has no neck tenderness.  CHEST: Clear with no adventitious sounds and normal pulmonary excursion.  CARDIAC: No murmurs or gallops. She seems to be in normal sinus rhythm.  ABDOMEN: Her abdomen is soft, nontender with no organomegaly and a well-healed lower midline scar. She has active bowel sounds. No masses noted. She has no abdominal tenderness.  EXTREMITIES: Minimal edema and no evidence of any deformities. She has full range of motion.  PSYCHIATRIC: Normal orientation, normal affect.   LABORATORY AND RADIOLOGICAL DATA:  I have independently reviewed her CT scan. She does appear to have some thickening in the proximal rectum, lower sigmoid which is suspicious for a mass in this area. There does not appear to be any obvious obstruction.   Laboratory values currently demonstrate a hemoglobin of 10.8,  hematocrit of 33%. Platelet count is normal. Electrolytes were otherwise unremarkable.   ASSESSMENT AND PLAN:  We will await the colonoscopy findings before rendering a final opinion. However, in an 81 year old woman with a compromised coronary circulation, surgical resection, which would include a low anterior resection versus an abdominoperineal resection, is a significant risk.  With a lower sigmoid proximal rectal tumor, chemoradiation may be of benefit to her.  If bleeding is significant problem with passage of stool, a proximal diversion either through a loop colostomy or loop ileostomy may be of some benefit during her treatment course. She would probably get an excellent symptomatic result from  neoadjuvant therapy that  may not require further surgical intervention in the course of her life span. We will be available to discuss these risks and  benefits with her once her final pathology is identified.   ____________________________ Rodena Goldmann III, MD rle:cb D: 08/03/2012 21:26:51 ET T: 08/03/2012 21:34:40 ET JOB#: [id number]  cc: Micheline Maze, MD, <Dictator> Modesto Charon, MD Lucilla Lame, MD  Rodena Goldmann MD ELECTRONICALLY SIGNED 08/11/2012 10:48

## 2014-07-21 NOTE — Consult Note (Signed)
Brief Consult Note: Diagnosis: Diarrhea.   Patient was seen by consultant.   Consult note dictated.   Comments: Ms. Napoli is a very pleasant 81 y/o female with 1 week hx of increased output watery diarrhea in colostomy bag.  She is s/p diverting loop colostomy for advanced rectal adenocarcinoma dx in 07/2012.  She is receiveing chemo/radiaition.  She likely has chemoradiation-induced diarrhea as she has responded well to sandostation SQ.  Would check for c diff & infectious diarrhea given her immuncompromised state as well.  Plan:  1) Agree w/ resuming sandostatin & she may need this daily as outpatient if available 2) c diff PCR & stool Cx 3) continue supportive measures  Thanks for consult.  Please see full dictated note. #[id number].  Electronic Signatures: Andria Meuse (NP)  (Signed 08-Jul-14 16:24)  Authored: Brief Consult Note   Last Updated: 08-Jul-14 16:24 by Andria Meuse (NP)

## 2014-07-21 NOTE — Consult Note (Signed)
PATIENT NAME:  Betty Dunn, Betty Dunn MR#:  [id number] DATE OF BIRTH:  1923/03/16  DATE OF CONSULTATION:  10/05/2012  REFERRING PHYSICIAN:  Gladstone Lighter, MD  CONSULTING PHYSICIAN:  Andria Meuse, NP PRIMARY CARE PHYSICIAN: Dr. Damita Dunnings SURGEON: Dr. Marina Gravel GASTROENTEROLOGIST:  Dr. Allen Norris ONCOLOGIST: Kennieth Francois, MD    REASON FOR CONSULTATION: Diarrhea.   HISTORY OF PRESENT ILLNESS: Betty Dunn is a pleasant 81 year old female who has history of locally advanced rectal adenocarcinoma stage IIIB, T3N2M0, status post diverting loop colostomy of the transverse colon by Dr. Marina Gravel on 08/31/2012. She is undergoing chemoradiation under the direction of Dr. Kallie Edward.  She had noticed profuse output in her colostomy for the last week. She describes it as watery and light brown. She denies any rectal bleeding, melena or mucus in the bag. She reports at one point it was so profuse that it busted the bag. She states that it comes on very quickly. She had an episode of syncope on 07/03. She denies any abdominal pain, denies any nausea or vomiting. Her appetite has been somewhat poor with chemotherapy. She denies any ill contacts or new medications. She has been started on Sandostatin 25 mcg q. 8 hours, and this seemed to help; however, when it was discontinued her diarrhea returned.   PAST MEDICAL AND SURGICAL HISTORY: Locally advanced rectal adenocarcinoma IIIB, T3N2M0, currently undergoing chemoradiation, coronary artery disease status post CABG, hypertension, hyperlipidemia, hypothyroidism, complete hysterectomy, arthritis, hemorrhoidal banding. She is status post diverting loop colostomy of the transverse colon with mini laparotomy.   MEDICATIONS PRIOR TO ADMISSION: Acetaminophen 325 mg q. 4 hours p.r.n., hydrocodone 325/5 mg 1/2 tablet at bedtime, carvedilol 6.25 mg b.i.d., levothyroxine 25 mcg daily, octreotide 25 mcg subcutaneous every 8 hours, simvastatin 40 mg daily at bedtime.   ALLERGIES: AMLODIPINE,  UNKNOWN; CIPRO, RASH; MILK OF MAGNESIA, GI DISTRESS; PENICILLIN, UNKNOWN; SULFA, UNKNOWN.  FAMILY HISTORY: Noncontributory.  SOCIAL HISTORY: She is a widow. She lives at [facility]. She has 1 son nearby. She denies any tobacco, alcohol or drug use.   REVIEW OF SYSTEMS:   CONSTITUTIONAL: She has generalized fatigue and weakness. She denies any fevers or chills.  NEUROLOGICAL: She did have dizziness and syncopal episode. Negative head CT.  Otherwise, negative complete 12-point review of systems.   PHYSICAL EXAMINATION: VITAL SIGNS: Temperature 97.8, pulse 74, respirations 18, blood pressure 104/54, O2 sat 97% on room air.  GENERAL: She is an elderly Caucasian female who is alert, oriented, pleasant, cooperative, in no acute distress.  HEENT: Sclerae clear, anicteric. Conjunctivae pink. Oropharynx pink and dry. She does have cracked lips.  NECK: Supple without any mass, thyromegaly.  CHEST: Heart regular rate and rhythm. Normal S1, S2. She has a right anterior Port-A-Cath with a clear site.  LUNGS: Clear to auscultation bilaterally.  ABDOMEN: Positive bowel sounds. She has a beefy-red stoma. She has a moderate amount of medium brown stool in the bag. Soft, nontender, without palpable mass or hepatosplenomegaly. No rebound, tenderness or guarding.  EXTREMITIES: Without clubbing or edema. No cyanosis. Good pulses.  NEUROLOGICAL: Grossly intact.  PSYCHIATRIC:  Normal mood and affect.  SKIN: Pink, warm and dry.   LABORATORY STUDIES: Glucose 108, BUN 21, calcium 8, magnesium 1.5, otherwise normal Met-7. Hemoglobin 9.1, hematocrit 27.6, white blood cell count 5.8, platelets 186.   IMPRESSION: Betty Dunn is a very pleasant 81 year old female with a 1-week history of increased output, watery diarrhea in her colostomy bag. She is status post diverting loop colostomy for advanced rectal adenocarcinoma diagnosed  in May 2014. She is receiving chemoradiation. She likely has chemoradiation-induced  diarrhea. She has responded well to Sandostatin subcutaneous. Would recheck for C. difficile and infectious diarrhea given her immunocompromised state as well.   PLAN: 1.  Agree with resuming Sandostatin, and she may need this daily as an outpatient, if available.  2.  C. diff., PCR and stool culture.  3.  Continue supportive measures.  Thank you for allowing [place] to participate in the care of Betty Dunn.    ____________________________ Andria Meuse, NP klj:cb D: 10/05/2012 16:23:30 ET T: 10/05/2012 16:54:45 ET JOB#: [id number]  cc: Andria Meuse, NP, <Dictator> Elveria Rising. Damita Dunnings, MD Jeannette How. Marina Gravel, MD Lucilla Lame, MD Rae Halsted. Kallie Edward, MD [place] ELECTRONICALLY SIGNED 10/06/2012 12:37

## 2014-07-21 NOTE — Consult Note (Signed)
Brief Consult Note: Diagnosis: Rectal Bleeding, Lower colonic mass.   Patient was seen by consultant.   Consult note dictated.   Discussed with Attending MD.   Comments: Ms. Rambeau is an 81 y/o female with large volume rectal bleeding and mild anemia (Hgb 10.8 stable).  Stenosis & wall thicking of rectosigmoid colon with retained feces on CT scan A/P with IV/oral contrast 4/25.  She will need colonoscopy with Dr Allen Norris tomorrow with possible boiopsies to r/o malignancy.  Will prep this evening with 1/2 normal prep & dulcolax.  RIsks/benefits explained.  Pt agrees w/ plan.  [facility] consulted to follow with [place].  Also, mild intrahepatic & extrahepatic biliary dilation is noted on CT.  Hepatic function panel is normal & this is likely physiologic s/p cholecystectomy given her advanced age.  She does have a mildly dilated pancreatic duct with possible small soft tissue mass.  Will consider further imaging after colonoscopy.  #[id number] #[id number].  Electronic Signatures: Andria Meuse (NP)  (Signed 06-May-14 17:31)  Authored: Brief Consult Note   Last Updated: 06-May-14 17:31 by Andria Meuse (NP)

## 2014-07-22 NOTE — H&P (Signed)
PATIENT NAME:  Betty Dunn, SPINNER MR#:  [id number] DATE OF BIRTH:  02/16/1923  DATE OF ADMISSION:  11/21/2013  REFERRING PHYSICIAN:  Dr. Edd Fabian.   PRIMARY CARE PHYSICIAN: Dr. Damita Dunnings.   CHIEF COMPLAINT: Right-sided chest/epigastric pain.   HISTORY OF PRESENT ILLNESS: This is an 81 year old female with known history of rectal cancer with metastasis to lungs, liver, spleen, status post diverting colostomy by Dr. Marina Gravel, finished chemotherapy and radiation, as well. Known history of hypertension, hyperlipidemia, hypothyroidism and coronary artery disease, status post CABG in 2012, presents with complaints of right epigastric/chest pain. Report  was of a sudden onset with intermittent episodes over the last few weeks. Every time it comes it lasts for a few minutes. It was severe today which prompted her to come to the ED. She reports it is sharp, radiating to the back and right shoulder. She denies any shortness of breath, any nausea, any vomiting, any diaphoresis accompanying this pain.  In the ED the patient had CT chest angiogram to rule out PE, which was negative for PE. Her CT chest did show worsening metastasis of liver and lungs.  Currently her chest pain has resolved.  The patient's first troponin was negative. Her labs did not show any significant abnormalities. EKG did not show any acute changes. The hospitalist service was requested to admit the patient for further evaluation of her pain. As well, the patient was noticed to have prolapsed ostomy; the patient reports this is usually chronic but not that severe and usually it resolves by the end of the day.   PAST MEDICAL HISTORY:  1.  Rectal cancer, status post diverting colostomy, chemotherapy and radiation.  Currently not receiving any further treatment.  2.  Hypothyroidism.  3.  Hypertension.  4.  Hyperlipidemia.  5.  Coronary artery disease status post CABG.   ALLERGIES:  AMLODIPINE, CIPRO, MILK OF MAGNESIA, PENICILLIN, SULFA.   SOCIAL  HISTORY: No smoking. No alcohol. No illicit drug use. Lives at home with her son.   FAMILY HISTORY: Significant for coronary artery disease, CVA in the family.   HOME MEDICATIONS: Currently, patient is unaware of any of her medications, she cannot recall any of them and does not have any medication list with her. We will await medication reconciliation in the morning by pharmacy.   REVIEW OF SYSTEMS:  CONSTITUTIONAL: Denies fever, chills, fatigue, weakness.  EYES: Denies blurry vision, double vision, inflammation, glaucoma.  ENT: Denies tinnitus, ear pain, hearing loss, epistaxis.  RESPIRATORY: Denies cough, wheezing, hemoptysis, dyspnea.  CARDIOVASCULAR: Reports right-sides epigastric/chest pain. He denies, edema, palpitation, syncope.  GASTROINTESTINAL: Denies nausea, vomiting, diarrhea, hematemesis, jaundice. Has right epigastric pain.  GENITOURINARY: Denies dysuria, hematuria, or renal colic.  ENDOCRINE: Denies polyuria, polydipsia, heat or cold intolerance.  HEMATOLOGY: Denies anemia, easy bruising, bleeding, diathesis.  INTEGUMENT: Denies acne, rash, or skin lesion.  MUSCULOSKELETAL: Denies any swelling, gout, cramps.  NEUROLOGIC: Denies CVA, TIA, tremors, vertigo, ataxia.  PSYCHIATRIC: Denies anxiety, insomnia, or depression.   PHYSICAL EXAMINATION:  VITAL SIGNS: Pulse 77, respiratory rate 21, blood pressure 108/63, saturating 96% on room air.  GENERAL: Frail, elderly female who looks comfortable in bed, in no apparent distress.  HEENT: Head atraumatic, normocephalic. Pupils equal, reactive to light. Pink conjunctivae, moist oral mucosa. No oral thrush.  NECK: Supple. No thyromegaly. No JVD. Trachea is midline. No carotid bruits.  CHEST: Good air entry bilaterally. No wheezing, rales, rhonchi. No use of respiratory accessory muscles.  CARDIOVASCULAR: S1, S2 heard. No rubs, murmur or gallops. PMI  nondisplaced. No chest tenderness to palpation.  ABDOMEN: Has right colostomy bag.  Bowel sounds present. No rebound, no guarding.  EXTREMITIES: No edema. No clubbing. No cyanosis. Pedal pulses +2 bilaterally.  PSYCHIATRIC: Pleasant, appropriate affect. Awake, alert x3. Intact judgment and insight.  NEUROLOGIC: Cranial nerves grossly intact. Motor 5 out of 5. No focal deficits.  MUSCULOSKELETAL: Joint effusion or erythema.   PERTINENT LABORATORY DATA: Glucose 139, BNP 7787, BUN 18, creatinine 0.95, sodium 134, potassium 3.1,, CO2 26, ALT 18, AST 102, alkaline phosphatase 242, direct bilirubin 0.8, total bilirubin 1.6, albumin 2, total protein 5.5, troponin less than 0.02, white blood cells 11.9, hemoglobin 10.2, platelet 205,000.   CT chest angiogram. No evidence of thoracic aortic aneurysm or dissection. No evidence of PE, multiple bilateral pulmonary metastases and diffuse hepatic and splenic metastasis.   ASSESSMENT AND PLAN:  1.  Atypical chest pain, noncardiac. This is most likely related to her worsening liver metastases versus her prolapsed ostomy. The patient will be admitted to rule out cardiac origin of her chest pain. We will cycle her cardiac enzymes. We will consult surgery to evaluate for her prolapsed ostomy. We will keep her on p.r.n. pain medications.  2.  Metastatic colon cancer. We will consult palliative care. This was discussed with the patient's son, we will see if they can assist with her pain control and discuss end-of-life management with this patient.  3.  Hypothyroidism.  4.  Hypertension.  5.  Hyperlipidemia.  6.  Coronary artery disease. The patient will be resumed back on her home medication when dose is known.  7.  Elevated liver function tests, this is most likely related to the patient's liver metastasis. We will hold up hepatotoxic medication.  8.  DVT prophylaxis. Subcutaneous heparin.   CODE STATUS: Discussed with the patient and son over the phone. The patient is DNR/DNI. The patient is agreeable to a palliative care consult to help address  patient care and pain management and end-of-life issues.   Total time spent on admission and patient care: 55 minutes.    ____________________________ Albertine Patricia, MD dse:lt D: 11/21/2013 01:32:48 ET T: 11/21/2013 06:56:57 ET JOB#: [id number]  cc: Albertine Patricia, MD, <Dictator> DAWOOD Graciela Husbands MD ELECTRONICALLY SIGNED 11/22/2013 3:32

## 2014-07-22 NOTE — Discharge Summary (Signed)
Dates of Admission and Diagnosis:  Date of Admission 21-Nov-2013   Date of Discharge 23-Nov-2013   Admitting Diagnosis epigastric pain, chest pain   Final Diagnosis Chest and epigastric pain- prolapsed colostomy Prolapsed colostomy- at baseline now- not a candidate for surgery. Htn Metastatic cancer Hypothyroidism    Chief Complaint/History of Present Illness an 81 year old female with known history of rectal cancer with metastasis to lungs, liver, spleen, status post diverting colostomy by Dr. Marina Gravel, finished chemotherapy and radiation, as well. Known history of hypertension, hyperlipidemia, hypothyroidism and coronary artery disease, status post CABG in 2012, presents with complaints of right epigastric/chest pain. Report  was of a sudden onset with intermittent episodes over the last few weeks. Every time it comes it lasts for a few minutes. It was severe today which prompted her to come to the ED. She reports it is sharp, radiating to the back and right shoulder. She denies any shortness of breath, any nausea, any vomiting, any diaphoresis accompanying this pain.  In the ED the patient had CT chest angiogram to rule out PE, which was negative for PE. Her CT chest did show worsening metastasis of liver and lungs.  Currently her chest pain has resolved.  The patient's first troponin was negative. Her labs did not show any significant abnormalities. EKG did not show any acute changes. The hospitalist service was requested to admit the patient for further evaluation of her pain. As well, the patient was noticed to have prolapsed ostomy; the patient reports this is usually chronic but not that severe and usually it resolves by the end of the day.   Allergies:  Cipro: Rash  Milk of Magnesia: GI Distress  Amlodipine Besylate: Unknown  Penicillin: Unknown  Sulfa drugs: Unknown  Hepatic:  23-Aug-15 20:54   Bilirubin, Total  1.6  Bilirubin, Direct  0.8 (Result(s) reported on 21 Nov 2013 at  01:26AM.)  Alkaline Phosphatase  242 (46-116 NOTE: New Reference Range 10/18/13)  SGPT (ALT) 18 (14-63 NOTE: New Reference Range 10/18/13)  SGOT (AST)  102  Total Protein, Serum  5.5  Albumin, Serum  2.0  Routine Micro:  23-Aug-15 20:54   Micro Text Report URINE CULTURE   COMMENT                   MIXED BACTERIAL ORGANISMS   COMMENT                   RESULTS SUGGESTIVE OF CONTAMINATION   COMMENT                   POSSIBLE CONTAMINATION W/FECAL FLORA   ANTIBIOTIC                        Specimen Source CLEAN CATCH  Culture Comment MIXED BACTERIAL ORGANISMS  Culture Comment . RESULTS SUGGESTIVE OF CONTAMINATION  Culture Comment    . POSSIBLE CONTAMINATION W/FECAL FLORA  Result(s) reported on 22 Nov 2013 at 10:41AM.  Routine Chem:  23-Aug-15 20:54   Glucose, Serum  139  BUN 18  Creatinine (comp) 0.95  Sodium, Serum  134  Potassium, Serum  3.1  Chloride, Serum 99  CO2, Serum 26  Calcium (Total), Serum  8.0  Anion Gap 9  Osmolality (calc) 272  eGFR (African American) >60  eGFR (Non-African American)  53 (eGFR values <40mL/min/1.73 m2 may be an indication of chronic kidney disease (CKD). Calculated eGFR is useful in patients with stable renal function. The eGFR calculation will  not be reliable in acutely ill patients when serum creatinine is changing rapidly. It is not useful in  patients on dialysis. The eGFR calculation may not be applicable to patients at the low and high extremes of body sizes, pregnant women, and vegetarians.)  B-Type Natriuretic Peptide [facility])  [phone] (Result(s) reported on 20 Nov 2013 at 09:47PM.)  25-Aug-15 05:05   Result Comment LABS - This specimen was collected through an   - indwelling catheter or arterial line.  - A minimum of 13mls of blood was wasted prior    - to collecting the sample.  Interpret  - results with caution.  Result(s) reported on 22 Nov 2013 at 05:26AM.  Glucose, Serum 67  BUN 11  Creatinine (comp) 0.78  Sodium, Serum 137   Potassium, Serum  3.3  Chloride, Serum 103  CO2, Serum 25  Calcium (Total), Serum  7.8  Anion Gap 9  Osmolality (calc) 271  eGFR (African American) >60  eGFR (Non-African American) >60 (eGFR values <69mL/min/1.73 m2 may be an indication of chronic kidney disease (CKD). Calculated eGFR is useful in patients with stable renal function. The eGFR calculation will not be reliable in acutely ill patients when serum creatinine is changing rapidly. It is not useful in  patients on dialysis. The eGFR calculation may not be applicable to patients at the low and high extremes of body sizes, pregnant women, and vegetarians.)  Magnesium, Serum  1.4 (1.8-2.4 THERAPEUTIC RANGE: 4-7 mg/dL TOXIC: > 10 mg/dL  -----------------------)  Cardiac:  23-Aug-15 20:54   Troponin I < 0.02 (0.00-0.05 0.05 ng/mL or less: NEGATIVE  Repeat testing in 3-6 hrs  if clinically indicated. >0.05 ng/mL: POTENTIAL  MYOCARDIAL INJURY. Repeat  testing in 3-6 hrs if  clinically indicated. NOTE: An increase or decrease  of 30% or more on serial  testing suggests a  clinically important change)  24-Aug-15 01:35   Troponin I < 0.02 (0.00-0.05 0.05 ng/mL or less: NEGATIVE  Repeat testing in 3-6 hrs  if clinically indicated. >0.05 ng/mL: POTENTIAL  MYOCARDIAL INJURY. Repeat  testing in 3-6 hrs if  clinically indicated. NOTE: An increase or decrease  of 30% or more on serial  testing suggests a  clinically important change)    06:09   Troponin I < 0.02 (0.00-0.05 0.05 ng/mL or less: NEGATIVE  Repeat testing in 3-6 hrs  if clinically indicated. >0.05 ng/mL: POTENTIAL  MYOCARDIAL INJURY. Repeat  testing in 3-6 hrs if  clinically indicated. NOTE: An increase or decrease  of 30% or more on serial  testing suggests a  clinically important change)  Routine UA:  23-Aug-15 20:54   Color (UA) YELLOW  Clarity (UA) CLEAR  Glucose (UA) NEGATIVE  Bilirubin (UA) 2+  Ketones (UA) NEGATIVE  Specific Gravity (UA)  1.015  Blood (UA) NEGATIVE  pH (UA) 6.0  Protein (UA) 30 mg/dL  Nitrite (UA) NEGATIVE  Leukocyte Esterase (UA) 1+ (Result(s) reported on 20 Nov 2013 at 11:17PM.)  WBC (UA) 5-15  Bacteria (UA) 1+ (TRACE/FEW)  Epithelial Cells (UA) 5-15 / HPF  Mucous (UA) PRESENT (Result(s) reported on 20 Nov 2013 at 11:17PM.)  Routine Hem:  23-Aug-15 20:54   WBC (CBC)  11.9  RBC (CBC)  3.60  Hemoglobin (CBC)  10.2  Hematocrit (CBC)  31.5  Platelet Count (CBC) 205 (Result(s) reported on 20 Nov 2013 at 09:38PM.)  MCV 87  MCH 28.4  MCHC 32.5  RDW  18.8  25-Aug-15 05:05   WBC (CBC) 9.9  RBC (CBC)  3.26  Hemoglobin (CBC)  9.3  Hematocrit (CBC)  28.7  Platelet Count (CBC) 193  MCV 88  MCH 28.6  MCHC 32.4  RDW  18.8  Neutrophil % 76.6  Lymphocyte % 10.7  Monocyte % 10.0  Eosinophil % 2.2  Basophil % 0.5  Neutrophil #  7.6  Lymphocyte # 1.1  Monocyte #  1.0  Eosinophil # 0.2  Basophil # 0.0   Pertinent Past History:  Pertinent Past History 1.  Rectal cancer, status post diverting colostomy, chemotherapy and radiation.  Currently not receiving any further treatment.  2.  Hypothyroidism.  3.  Hypertension.  4.  Hyperlipidemia.  5.  Coronary artery disease status post CABG.   Hospital Course:  Hospital Course 1.  Atypical chest pain, noncardiac. This is most likely related to her worsening liver metastases versus her prolapsed ostomy.   ruled out cardiac origin of her chest pain.   appreciated consult surgery to evaluate for her prolapsed ostomy, not a candidate for surgery- and at her baseline. , p.r.n. pain medications.   pain came under control.    2.  Metastatic colon cancer.   consulted palliative care.  discuss end-of-life management with this patient.    as per family- pt is too weak and they prefer to place in a facility with hospice care.   could not go yesterday because of pending insurance authorization.  3.  Hypothyroidism. not on meds at home. 4.  Hypertension. BP  stable. 5.  Hyperlipidemia.  6.  Coronary artery disease. not on home meds. will leave as is- as feeling fine, and going to NH with hospice care- have terminal metastatic cancer. 7.  Elevated liver function tests, this is most likely related to the patient's liver metastasis. hold up hepatotoxic medication.   Condition on Discharge Stable   Code Status:  Code Status No Code/Do Not Resuscitate   DISCHARGE INSTRUCTIONS HOME MEDS:  Medication Reconciliation: Patient's Home Medications at Discharge:     Medication Instructions  ranitidine 150 mg oral tablet  1 tab(s) orally 2 times a day   oxybutynin 5 mg/24 hours oral tablet, extended release  1 tab(s) orally once a day   mirtazapine 15 mg oral tablet  1 tab(s) orally once a day (at bedtime)   morphine 20 mg/ml oral concentrate  0.25 milliliter(s) orally every 4 hours, As Needed, moderate pain (4-6/10)/dyspnea.   ondansetron 4 mg oral tablet, disintegrating  1 tab(s) orally 3 times a day, As Needed - for Nausea, Vomiting     Physician's Instructions:  Diet Low Sodium   Activity Limitations As tolerated   Return to Work Not Applicable   Time frame for Follow Up Appointment 1-2 weeks  PMD   Other Comments routine follow up with PMD.     Teresa Pelton N(Family Physician): [address] [address], [place], [place] [address], [place] [phone]  Electronic Signatures: Vaughan Basta (MD)  (Signed 26-Aug-15 10:43)  Authored: ADMISSION DATE AND DIAGNOSIS, CHIEF COMPLAINT/HPI, Allergies, PERTINENT LABS, PERTINENT PAST HISTORY, HOSPITAL COURSE, DISCHARGE INSTRUCTIONS HOME MEDS, PATIENT INSTRUCTIONS, Follow Up Physician   Last Updated: 26-Aug-15 10:43 by Vaughan Basta (MD)

## 2017-10-29 ENCOUNTER — Ambulatory Visit: Admit: 2017-10-29 | Payer: Self-pay | Admitting: Nurse Practitioner
